# Patient Record
Sex: Male | Born: 1980 | Race: White | Hispanic: No | Marital: Single | Smoking: Current every day smoker
Health system: Southern US, Community
[De-identification: ages and names within clinical notes are randomized; demographics above are authoritative.]

## PROBLEM LIST (undated history)

## (undated) DIAGNOSIS — K219 Gastro-esophageal reflux disease without esophagitis: Secondary | ICD-10-CM

## (undated) DIAGNOSIS — C801 Malignant (primary) neoplasm, unspecified: Secondary | ICD-10-CM

## (undated) HISTORY — DX: Gastro-esophageal reflux disease without esophagitis: K21.9

## (undated) HISTORY — DX: Malignant (primary) neoplasm, unspecified: C80.1

---

## 2006-10-04 ENCOUNTER — Emergency Department: Payer: Self-pay | Admitting: Emergency Medicine

## 2011-09-11 ENCOUNTER — Emergency Department: Payer: Self-pay | Admitting: Emergency Medicine

## 2012-08-23 DIAGNOSIS — C801 Malignant (primary) neoplasm, unspecified: Secondary | ICD-10-CM

## 2012-08-23 HISTORY — DX: Malignant (primary) neoplasm, unspecified: C80.1

## 2013-03-01 ENCOUNTER — Encounter: Payer: Self-pay | Admitting: General Surgery

## 2013-03-01 ENCOUNTER — Ambulatory Visit (INDEPENDENT_AMBULATORY_CARE_PROVIDER_SITE_OTHER): Payer: Self-pay | Admitting: General Surgery

## 2013-03-01 VITALS — BP 110/72 | HR 60 | Resp 12 | Ht 72.0 in | Wt 221.0 lb

## 2013-03-01 DIAGNOSIS — M799 Soft tissue disorder, unspecified: Secondary | ICD-10-CM

## 2013-03-01 DIAGNOSIS — R229 Localized swelling, mass and lump, unspecified: Secondary | ICD-10-CM

## 2013-03-01 DIAGNOSIS — M7989 Other specified soft tissue disorders: Secondary | ICD-10-CM

## 2013-03-01 DIAGNOSIS — R222 Localized swelling, mass and lump, trunk: Secondary | ICD-10-CM

## 2013-03-01 DIAGNOSIS — C801 Malignant (primary) neoplasm, unspecified: Secondary | ICD-10-CM | POA: Insufficient documentation

## 2013-03-01 NOTE — Progress Notes (Signed)
Patient ID: Lance Bentley, male   DOB: [birth year], 32 y.o.   MRN: [id number]  Chief Complaint  Patient presents with  . Other    right upper back cystic mass    HPI Lance Bentley is a 32 y.o. male who presents for an evaluation of a right upper back cystic mass. Noticed in 2012 but over past 4 months it has gotten larger.  States he hasn't noticed much pain with the area. It has become increasingly prominent, increasing self-consciousness.  The patient reports no history of trauma to this area.   HPI  Past Medical History  Diagnosis Date  . GERD (gastroesophageal reflux disease)     History reviewed. No pertinent past surgical history.  History reviewed. No pertinent family history.  Social History History  Substance Use Topics  . Smoking status: Current Every Day Smoker -- 0.50 packs/day for 10 years  . Smokeless tobacco: Not on file  . Alcohol Use: Yes    No Known Allergies  No current outpatient prescriptions on file.   No current facility-administered medications for this visit.    Review of Systems Review of Systems  Constitutional: Negative.   Respiratory: Negative.   Cardiovascular: Negative.     Blood pressure 110/72, pulse 60, resp. rate 12, height 6' (1.829 m), weight 221 lb (100.245 kg).  Physical Exam Physical Exam  Constitutional: He is oriented to person, place, and time. He appears well-developed and well-nourished.  Neurological: He is alert and oriented to person, place, and time.  Skin: Skin is warm and dry.   6x7 cm right upper back lesion with slight overlying erythema and increased vascularity with no evidence of obvious infection or warmth. Data Reviewed No data reviewed.  Assessment    Sebaceous cyst of the right upper back.    Plan    Excision was discussed and accepted. The skin was prepped with alcohol and initially 30 cc of 0.5% Xylocaine with 0.25% Marcaine with 1-200,000 of epinephrine was utilized for local anesthesia and  well tolerated. This was supplemented with an additional 10 cc of 1% plain Xylocaine during the procedure. ChloraPrep applied to the skin. A transverse incision over the mass was completed the subcutaneous fat was quite thin. The mass was excised from the adjacent tissue with sharp dissection. A number of vascular attachments especially in the inferior medial aspect were ligated with 3-0 Vicryl figure-of-eight sutures and 3-0 Vicryl ties. The lesion extended down to the deep muscle fascia but did not file at the muscle. Approximately 25 cc of blood loss was encountered. The wound was closed in layers with interrupted 3-0 Vicryl figure-of-eight sutures in multiple layers. This was done to obliterate dead space. The skin was then closed with a running 3-0 Vicryl subcuticular suture. Benzoin and Steri-Strips followed by Telfa and Tegaderm dressing was applied. The patient initially declined prescription analgesics (reports a past history of cocaine use). He did call back about 4 hours post procedure for a a prescription.  The patient will followup next week with a nurse for wound check. He'll be contacted by phone when pathology is available.     Norco 5/325mg  1 or 2 every 4 hours as needed for pain #30 no refills called in to [facility]   Earline Mayotte 03/02/2013, 9:26 AM

## 2013-03-01 NOTE — Patient Instructions (Addendum)
Keep area clean The dressing is water proof you may shower May use tylenol or Advil for pain

## 2013-03-02 ENCOUNTER — Telehealth: Payer: Self-pay | Admitting: *Deleted

## 2013-03-02 ENCOUNTER — Encounter: Payer: Self-pay | Admitting: General Surgery

## 2013-03-02 DIAGNOSIS — R222 Localized swelling, mass and lump, trunk: Secondary | ICD-10-CM | POA: Insufficient documentation

## 2013-03-02 NOTE — Telephone Encounter (Signed)
Message copied by [name] on Fri Mar 02, 2013  9:33 AM ------      Message from: Lance Bentley, Lance Bentley      Created: Fri Mar 02, 2013  9:23 AM       Contact patient to see how he is doing. If needed, he can come in for RN dressing check this AM. Thanks.  ------

## 2013-03-02 NOTE — Telephone Encounter (Signed)
Patient states he is doing well. He has not noticed any drainage from the area.  The only complaint was pain when laying on his back.Patient to call us if he has any questions and will follow up with the nurse on 03/08/13.

## 2013-03-04 ENCOUNTER — Telehealth: Payer: Self-pay | Admitting: General Surgery

## 2013-03-04 DIAGNOSIS — C801 Malignant (primary) neoplasm, unspecified: Secondary | ICD-10-CM

## 2013-03-04 NOTE — Telephone Encounter (Signed)
The patient was contacted with the preliminary results of his recently completed right posterior shoulder excision. 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  We'll make arrangements to get together on July 14 at 5 PM to review treatment recommendations.

## 2013-03-05 ENCOUNTER — Encounter: Payer: Self-pay | Admitting: General Surgery

## 2013-03-05 ENCOUNTER — Ambulatory Visit (INDEPENDENT_AMBULATORY_CARE_PROVIDER_SITE_OTHER): Payer: Self-pay | Admitting: General Surgery

## 2013-03-05 VITALS — BP 130/72 | HR 82 | Resp 12 | Ht 72.0 in | Wt 221.0 lb

## 2013-03-05 DIAGNOSIS — C801 Malignant (primary) neoplasm, unspecified: Secondary | ICD-10-CM

## 2013-03-06 ENCOUNTER — Encounter: Payer: Self-pay | Admitting: General Surgery

## 2013-03-06 NOTE — Progress Notes (Signed)
Patient ID: Lance Bentley, male   DOB: Jan 20, 1981, 32 y.o.   MRN: [id number]  The patient had been contacted by phone with the results of his recent excisional biopsy. He is accompanied today by his mother.  Examination of the excision site shows some slight thickening medially, but otherwise a flat wound without erythema induration or fluctuance. Minimal tenderness.  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  This was a fairly large lesion of 5+ centimeters. Primary therapy is wide excision and with high-grade lesions, postoperative radiation therapy.  I think at present the ideal thing would be to allow his present would heal completely and then return in approximately 2-3 weeks for formal excision. In the interval a literature review we completed to determine if metastatic workup with CT is required.

## 2013-03-08 ENCOUNTER — Ambulatory Visit (INDEPENDENT_AMBULATORY_CARE_PROVIDER_SITE_OTHER): Payer: Self-pay | Admitting: *Deleted

## 2013-03-08 DIAGNOSIS — C801 Malignant (primary) neoplasm, unspecified: Secondary | ICD-10-CM

## 2013-03-08 NOTE — Progress Notes (Signed)
Patient ID: [employer], male   DOB: [birth year], 32 y.o.   MRN: [id number]  The patient came in for wound check today. The area is healing well with no redness or swelling.

## 2013-03-19 ENCOUNTER — Telehealth: Payer: Self-pay | Admitting: *Deleted

## 2013-03-19 DIAGNOSIS — C499 Malignant neoplasm of connective and soft tissue, unspecified: Secondary | ICD-10-CM

## 2013-03-19 DIAGNOSIS — C496 Malignant neoplasm of connective and soft tissue of trunk, unspecified: Secondary | ICD-10-CM

## 2013-03-19 NOTE — Telephone Encounter (Signed)
Please arrange for a PA and lateral chest x-ray: Diagnosis leiomyosarcoma of the back. Thank you  Patient notified as instructed and he will have completed tomorrow, 03-20-13.

## 2013-03-20 ENCOUNTER — Ambulatory Visit: Payer: Self-pay | Admitting: General Surgery

## 2013-03-21 ENCOUNTER — Encounter: Payer: Self-pay | Admitting: General Surgery

## 2013-03-23 ENCOUNTER — Telehealth: Payer: Self-pay | Admitting: *Deleted

## 2013-03-23 DIAGNOSIS — C499 Malignant neoplasm of connective and soft tissue, unspecified: Secondary | ICD-10-CM

## 2013-03-23 NOTE — Telephone Encounter (Signed)
Message copied by Nicholes Mango on Fri Mar 23, 2013  9:23 AM ------      Message from: Vaughn, Merrily Pew      Created: Thu Mar 22, 2013  4:09 PM      Regarding: RE: Patient       Contact: [phone]       Notify CXR was OK.  Will need additional surgery.  Now that CXR has been clear, he will need to have an MRI of the area prior to surgery. Left shoulder, attn: radiologist to be sure medial aspect of tumor is identified. W/ and W/O contrast.       OV after MRI completed.       Marcelino Duster: Arrange for       ----- Message -----         From: Jena Gauss, CMA         Sent: 03/22/2013   2:46 PM           To: Earline Mayotte, MD      Subject: Patient                                                  Pt called regarding surgery, he was wanting to know if he is going to have to have surgery or not, he was a little concerned when it might would be since he is going out of town sometime in the next week or so. He said he was waiting on chest xrays to come in and I scan them in the system already so they should be in there. Thanks!        ------

## 2013-03-23 NOTE — Telephone Encounter (Signed)
Patient notified as instructed. MRI Left shoulder WWO contrast has been scheduled for 03-29-13 at 3 pm (arrive 2:30 pm). Prep: none. He is aware of all instructions and will follow up in the office to review MRI and for pre-op evaluation.

## 2013-03-29 ENCOUNTER — Telehealth: Payer: Self-pay | Admitting: *Deleted

## 2013-03-29 ENCOUNTER — Ambulatory Visit: Payer: Self-pay | Admitting: General Surgery

## 2013-03-29 NOTE — Telephone Encounter (Signed)
Fleet Contras from the MRI department called to question order for left shoulder MRI WWO contrast. Dr. Lemar Livings was paged and verbally states this should have been the right shoulder. New order will be sent through order facilitator. Fleet Contras notified of change.

## 2013-04-04 ENCOUNTER — Encounter: Payer: Self-pay | Admitting: General Surgery

## 2013-04-04 ENCOUNTER — Ambulatory Visit (INDEPENDENT_AMBULATORY_CARE_PROVIDER_SITE_OTHER): Payer: Self-pay | Admitting: General Surgery

## 2013-04-04 VITALS — BP 130/72 | HR 80 | Resp 12 | Ht 72.0 in | Wt 216.0 lb

## 2013-04-04 DIAGNOSIS — C499 Malignant neoplasm of connective and soft tissue, unspecified: Secondary | ICD-10-CM

## 2013-04-04 NOTE — Progress Notes (Signed)
Patient ID: Lance Bentley, male   DOB: March 11, 1981, 32 y.o.   MRN: [id number]  Chief Complaint  Patient presents with  . Pre-op Exam    follow up mri left shoulder    HPI Lance Bentley is a 32 y.o. male who presents for a pre op evaluation. MRI of left shoulder was done on 03/29/13. The only complaint today is he has a boil on the right leg that showed up on 03/20/13. 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 This rub the area raw, he then went to [employer] and he noticed marked inflammation which has now nearly completely resolved.   The patient's mother accompanies him today. HPI  Past Medical History  Diagnosis Date  . GERD (gastroesophageal reflux disease)   . 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 No pertinent past surgical history.  No family history on file.  Social History History  Substance Use Topics  . Smoking status: Current Every Day Smoker -- 0.50 packs/day for 10 years  . Smokeless tobacco: Not on file  . Alcohol Use: Yes    No Known Allergies  No current outpatient prescriptions on file.   No current facility-administered medications for this visit.    Review of Systems Review of Systems  Constitutional: Negative.   Respiratory: Negative.   Cardiovascular: Negative.     Blood pressure 130/72, pulse 80, resp. rate 12, height 6' (1.829 m), weight 216 lb (97.977 kg).  Physical Exam Physical Exam  Constitutional: He is oriented to person, place, and time. He appears well-developed and well-nourished.  Cardiovascular: Normal rate, regular rhythm and normal heart sounds.   No murmur heard. Pulmonary/Chest: Effort normal and breath sounds normal.  Neurological: He is alert and oriented to person, place, and time.  Skin: Skin is warm and dry.     Well healed incision focal nodularity inferior.    Right posterior lateral knee healing area with focal thickening.     Data Reviewed Chest  x-ray showed no evidence of metastatic disease.  MRI showed no extension of the deep fascia and only local inflammatory changes from his biopsy.  Assessment    Leiomyosarcoma of the right posterior shoulder.    Plan    The patient states was present at the [facility] tumor board. Wide excision to negative margins will likely be really necessary therapy.  Risks associated with surgery including shoulder limitations while healing occurs were reviewed.  We'll postpone surgery to the end of the month to allow the inflammatory changes in the right popliteal fossa to completely resolve.     Patient's surgery has been scheduled for 04-19-13 at [facility].   Lance Bentley 04/04/2013, 10:31 PM

## 2013-04-04 NOTE — Patient Instructions (Addendum)
Patient to be scheduled for right back excision.   This patient's surgery has been scheduled for 04-19-13 at [facility].

## 2013-04-10 ENCOUNTER — Other Ambulatory Visit: Payer: Self-pay | Admitting: General Surgery

## 2013-04-10 ENCOUNTER — Ambulatory Visit: Payer: Self-pay | Admitting: General Surgery

## 2013-04-10 DIAGNOSIS — C499 Malignant neoplasm of connective and soft tissue, unspecified: Secondary | ICD-10-CM

## 2013-04-19 ENCOUNTER — Ambulatory Visit: Payer: Self-pay | Admitting: General Surgery

## 2013-04-19 DIAGNOSIS — C768 Malignant neoplasm of other specified ill-defined sites: Secondary | ICD-10-CM

## 2013-04-19 HISTORY — PX: OTHER SURGICAL HISTORY: SHX169

## 2013-04-20 ENCOUNTER — Encounter: Payer: Self-pay | Admitting: General Surgery

## 2013-04-20 LAB — PATHOLOGY REPORT

## 2013-04-24 ENCOUNTER — Encounter: Payer: Self-pay | Admitting: General Surgery

## 2013-04-25 ENCOUNTER — Encounter: Payer: Self-pay | Admitting: General Surgery

## 2013-04-25 ENCOUNTER — Ambulatory Visit: Payer: Self-pay | Admitting: General Surgery

## 2013-04-25 ENCOUNTER — Ambulatory Visit (INDEPENDENT_AMBULATORY_CARE_PROVIDER_SITE_OTHER): Payer: Self-pay | Admitting: General Surgery

## 2013-04-25 VITALS — BP 124/72 | HR 74 | Resp 12 | Ht 72.0 in | Wt 218.0 lb

## 2013-04-25 DIAGNOSIS — C499 Malignant neoplasm of connective and soft tissue, unspecified: Secondary | ICD-10-CM

## 2013-04-25 LAB — PATHOLOGY

## 2013-04-25 NOTE — Progress Notes (Signed)
Patient ID: Lance Bentley, male   DOB: 29-Apr-1981, 32 y.o.   MRN: [id number]  Chief Complaint  Patient presents with  . Routine Post Op    shoulder tumor excision    HPI Lance Bentley is a 32 y.o. male who presents for a post op right posterior shoulder excision. The procedure was done on 04/19/13. No complaints at this time.   HPI  Past Medical History  Diagnosis Date  . GERD (gastroesophageal reflux disease)   . 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 No pertinent past surgical history.  History reviewed. No pertinent family history.  Social History History  Substance Use Topics  . Smoking status: Current Every Day Smoker -- 0.50 packs/day for 10 years  . Smokeless tobacco: Not on file  . Alcohol Use: Yes    No Known Allergies  No current outpatient prescriptions on file.   No current facility-administered medications for this visit.    Review of Systems Review of Systems  Constitutional: Negative.   Respiratory: Negative.   Cardiovascular: Negative.     Blood pressure 124/72, pulse 74, resp. rate 12, height 6' (1.829 m), weight 218 lb (98.884 kg).  Physical Exam Physical Exam  Constitutional: He is oriented to person, place, and time. He appears well-nourished.  Neurological: He is oriented to person, place, and time.  Skin: Skin is warm and dry.   the incision is healing well. No evidence of undue tension. Staples were removed by the CMA and replaced with benzoin and Steri-Strips. The small nylon stitch to mark the area of closest approach of the tumor to the line of resection remains in place.  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 This is up stage from the original pathology.  Assessment    Doing well status post wide excision.    Plan    The patient's case will be brie present at the [facility] tumor board on September 4. The inferior margin is negative but very close for a DFSP  lesion. I anticipate that reresection to obtain a 2 cm margin will be appropriate. The upstaging too great to this of concern. The patient may be a candidate for post wide excision radiation therapy.        Earline Mayotte 04/27/2013, 10:43 AM

## 2013-04-25 NOTE — Patient Instructions (Addendum)
Patient to return in 3 weeks.  

## 2013-04-27 ENCOUNTER — Encounter: Payer: Self-pay | Admitting: General Surgery

## 2013-04-30 ENCOUNTER — Telehealth: Payer: Self-pay

## 2013-04-30 ENCOUNTER — Ambulatory Visit: Payer: Self-pay | Admitting: General Surgery

## 2013-04-30 NOTE — Telephone Encounter (Signed)
Message copied by Sinda Du on Mon Apr 30, 2013 12:54 PM ------      Message from: Earline Mayotte      Created: Fri Apr 27, 2013 10:42 AM       Please notify the patient that his case was presented at our weekly tumor conference yesterday.  We need to proceed with re-excision after his next follow up to get the appropriate margin of tissue. (As we discussed at the time of his Sept 3 appt). ------

## 2013-04-30 NOTE — Telephone Encounter (Signed)
Message copied by Sinda Du on Mon Apr 30, 2013  1:36 PM ------      Message from: [place], [place]      Created: Fri Apr 27, 2013 10:42 AM       Please notify the patient that his case was presented at our weekly tumor conference yesterday.  We need to proceed with re-excision after his next follow up to get the appropriate margin of tissue. (As we discussed at the time of his Sept 3 appt). ------

## 2013-04-30 NOTE — Telephone Encounter (Signed)
Patient returning call. Patient notified of need for re-excision and is ok with this. Will return on the 24th for follow up visit.

## 2013-05-04 ENCOUNTER — Telehealth: Payer: Self-pay | Admitting: *Deleted

## 2013-05-04 NOTE — Telephone Encounter (Signed)
Patient called the office reporting that he is having pain in his right upper shoulder status post excision done one 04-19-13. This patient states that it is very tight where the stitches are and the pain in his neck feels like someone has punched him. He has returned to work and states that he is not taking anything for the pain at this time. Patient reports that you had given him a prescription for Percocet after surgery but he could not tolerate this as it made his stomach hurt and made him throw up.  I told him he could take Aleve or Advil for the pain since he is already back at work. This patient has an appointment for follow up on 05-16-13. Patient would appreciate a call back from you on his cell number to address his concerns.

## 2013-05-05 ENCOUNTER — Telehealth: Payer: Self-pay | Admitting: General Surgery

## 2013-05-05 NOTE — Telephone Encounter (Signed)
Patient called yesterday reporting increased pain. 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 Encourage to use Aleve, 2 tabs BID, with tylenol prn at HS. Heating pad (taking care to position in an area of skin with normal sensation will help as well.  May need to change Sept 24 appt secondary to work needs.   He will call the office Monday, Sept 15 to reschedule.

## 2013-05-16 ENCOUNTER — Ambulatory Visit: Payer: Self-pay | Admitting: General Surgery

## 2013-06-11 ENCOUNTER — Encounter: Payer: Self-pay | Admitting: General Surgery

## 2013-06-11 ENCOUNTER — Ambulatory Visit (INDEPENDENT_AMBULATORY_CARE_PROVIDER_SITE_OTHER): Payer: Self-pay | Admitting: General Surgery

## 2013-06-11 VITALS — BP 146/100 | HR 88 | Resp 14 | Ht 72.0 in | Wt 229.0 lb

## 2013-06-11 DIAGNOSIS — C499 Malignant neoplasm of connective and soft tissue, unspecified: Secondary | ICD-10-CM

## 2013-06-11 NOTE — Progress Notes (Signed)
Patient ID: Lance Bentley, male   DOB: 09-21-1980, 32 y.o.   MRN: [id number]  Chief Complaint  Patient presents with  . Follow-up    folllow up sarcoma on back    HPI Lance Bentley is a 32 y.o. male who presents for a follow up of a right back sarcoma. The patient underwent a wide excision of the right back on 04/19/13. Patient denies any new problems at this time and is overall doing well.   HPI  Past Medical History  Diagnosis Date  . GERD (gastroesophageal reflux disease)   . 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 Wide excision Right 04/19/13    right back excision leiomyosarcome    History reviewed. No pertinent family history.  Social History History  Substance Use Topics  . Smoking status: Current Every Day Smoker -- 0.50 packs/day for 10 years  . Smokeless tobacco: Never Used  . Alcohol Use: Yes    No Known Allergies  No current outpatient prescriptions on file.   No current facility-administered medications for this visit.    Review of Systems Review of Systems  Constitutional: Negative.   Respiratory: Negative.   Cardiovascular: Negative.     Blood pressure 146/100, pulse 88, resp. rate 14, height 6' (1.829 m), weight 229 lb (103.874 kg).  Physical Exam Physical Exam  Constitutional: He is oriented to person, place, and time. He appears well-developed and well-nourished.  Neck: No thyromegaly present.  Cardiovascular: Normal rate, regular rhythm and normal heart sounds.   No murmur heard. Pulmonary/Chest: Effort normal and breath sounds normal.  Lymphadenopathy:    He has no cervical adenopathy.  Neurological: He is alert and oriented to person, place, and time.  Skin: Skin is warm and dry.     Well healed scar on posterior right shoulder.     Data Reviewed Pathology dated April 19, 2013 showed a dermatofibrosarcoma protuberans overlying fibrosarcomatous changes at the previous biopsy  site.the tumor size was estimated at up to 9.8 cm. All margins were negative, although the inferior margin was close at 1 mm. The deep margin was 1.2 cm which represented the underlying fascia. T2a.  The patient's case had been presented at the [facility] tumor board and wide excision to a 2 cm margin inferiorly with postoperative radiation therapy due to the findings of DFSP was recommended.  Assessment    Dermatofibrosarcoma protuburans.     Plan    The indications for reexcision of the inferior margin was discussed as well as the recommendations for post surgery radiation therapy.     Patient's surgery has been scheduled for 07-24-13 at [facility].   Earline Mayotte 06/11/2013, 9:25 PM

## 2013-06-11 NOTE — Patient Instructions (Addendum)
Patient to be scheduled for surgery.   Patient's surgery has been scheduled for 07-24-13 at [facility].

## 2013-07-14 ENCOUNTER — Other Ambulatory Visit: Payer: Self-pay | Admitting: General Surgery

## 2013-07-14 DIAGNOSIS — C499 Malignant neoplasm of connective and soft tissue, unspecified: Secondary | ICD-10-CM

## 2013-07-24 ENCOUNTER — Ambulatory Visit: Payer: Self-pay | Admitting: General Surgery

## 2013-07-24 DIAGNOSIS — D235 Other benign neoplasm of skin of trunk: Secondary | ICD-10-CM

## 2013-07-24 HISTORY — PX: SKIN LESION EXCISION: SHX2412

## 2013-07-25 ENCOUNTER — Encounter: Payer: Self-pay | Admitting: General Surgery

## 2013-07-28 ENCOUNTER — Telehealth: Payer: Self-pay | Admitting: General Surgery

## 2013-07-28 NOTE — Telephone Encounter (Signed)
A message was left for the patient at the recent resection showed no evidence of residual dermatofibroma sarcoma protuberans.

## 2013-07-30 ENCOUNTER — Encounter: Payer: Self-pay | Admitting: General Surgery

## 2013-07-31 ENCOUNTER — Ambulatory Visit (INDEPENDENT_AMBULATORY_CARE_PROVIDER_SITE_OTHER): Payer: Self-pay | Admitting: General Surgery

## 2013-07-31 ENCOUNTER — Encounter: Payer: Self-pay | Admitting: General Surgery

## 2013-07-31 VITALS — BP 150/80 | HR 84 | Temp 96.7°F | Resp 18 | Ht 72.0 in | Wt 225.0 lb

## 2013-07-31 DIAGNOSIS — C499 Malignant neoplasm of connective and soft tissue, unspecified: Secondary | ICD-10-CM

## 2013-07-31 NOTE — Patient Instructions (Addendum)
Aleve 2 tablets twice a a day May use heating pad for comfort. prescription for Percocet 5/325 mg #30 given

## 2013-07-31 NOTE — Progress Notes (Signed)
Patient ID: Hatem Cull, male   DOB: Jul 13, 1981, 32 y.o.   MRN: [id number]  Chief Complaint  Patient presents with  . Routine Post Op    HPI Jacque Garrels is a 32 y.o. male.  Here for follow up excision right mass lesion done 07-24-13.  States he is having pain in the area and has run out of pain medication, using ibuprofen for pain. Drain remains in place. HPI  Past Medical History  Diagnosis Date  . GERD (gastroesophageal reflux disease)   . 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 Wide excision Right 04/19/13    right back excision leiomyosarcome  . Skin lesion excision  07-24-13    No family history on file.  Social History History  Substance Use Topics  . Smoking status: Current Every Day Smoker -- 0.50 packs/day for 10 years  . Smokeless tobacco: Never Used  . Alcohol Use: Yes    No Known Allergies  Current Outpatient Prescriptions  Medication Sig Dispense Refill  . ibuprofen (ADVIL,MOTRIN) 200 MG tablet Take 200 mg by mouth every 6 (six) hours as needed.       No current facility-administered medications for this visit.    Review of Systems Review of Systems  Constitutional: Negative.   Respiratory: Negative.   Cardiovascular: Negative.     Blood pressure 150/80, pulse 84, temperature 96.7 F (35.9 C), temperature source Oral, resp. rate 18, height 6' (1.829 m), weight 225 lb (102.059 kg).  Physical Exam Physical Exam  Constitutional: He is oriented to person, place, and time. He appears well-developed and well-nourished.  Neurological: He is alert and oriented to person, place, and time.  Skin: Skin is warm and dry.  Drain removed, gauze applied. Sutures removed and steri strips applied.    Data Reviewed Pathology on the resected specimen showed no evidence of residual disease. The drain record shows volumes follow to less than 30 cc per day for the last 3 days.  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     Plan    The dissection used to remove the old seroma cavity is likely the source of the patient's pain. He had been advised to make use of Aleve, 2 tablets b.i.d. On his discharge instructions. He'll begin this today. A new prescription for Percocet 5/325, #30 with description 1 p.o. Q.4 h. P.r.n. For pain was provided. No refills. Caution with narcotic use was emphasized to his past history of addiction issues. He reports his girlfriend, Herbert Seta, is in charge of his pain pills. The patient was advised that he may require an aspiration on followup that this is not an indication of any tumor recurrence.  After his last surgery with the identification of a left well-differentiated tumor, post surgery radiation therapy was recommended during review by the [facility] tumor board. We'll make arrangements for evaluation by radiation oncology at his next visit.         Earline Mayotte 07/31/2013, 9:23 PM

## 2013-08-06 ENCOUNTER — Encounter: Payer: Self-pay | Admitting: General Surgery

## 2013-08-06 ENCOUNTER — Ambulatory Visit (INDEPENDENT_AMBULATORY_CARE_PROVIDER_SITE_OTHER): Payer: Self-pay | Admitting: General Surgery

## 2013-08-06 ENCOUNTER — Other Ambulatory Visit: Payer: Self-pay | Admitting: *Deleted

## 2013-08-06 VITALS — BP 128/76 | HR 78 | Resp 14 | Ht 72.0 in | Wt 225.0 lb

## 2013-08-06 DIAGNOSIS — C44599 Other specified malignant neoplasm of skin of other part of trunk: Secondary | ICD-10-CM | POA: Insufficient documentation

## 2013-08-06 DIAGNOSIS — C499 Malignant neoplasm of connective and soft tissue, unspecified: Secondary | ICD-10-CM

## 2013-08-06 NOTE — Progress Notes (Signed)
Patient has been scheduled for an appointment with Dr. Rushie Chestnut at the [facility] for 08-10-13 at 9 am. This patient is aware of date, time, and instructions.

## 2013-08-06 NOTE — Progress Notes (Signed)
Patient ID: Lance Bentley, male   DOB: 1981/03/22, 32 y.o.   MRN: [id number]  Chief Complaint  Patient presents with  . Routine Post Op    excision    HPI Lance Bentley is a 32 y.o. male here for follow up excision right mass lesion done 07-24-13.Patient states he is having no pain in his back.    HPI  Past Medical History  Diagnosis Date  . GERD (gastroesophageal reflux disease)   . 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 Wide excision Right 04/19/13    right back excision leiomyosarcome  . Skin lesion excision  07-24-13    No family history on file.  Social History History  Substance Use Topics  . Smoking status: Current Every Day Smoker -- 0.50 packs/day for 10 years  . Smokeless tobacco: Never Used  . Alcohol Use: Yes    No Known Allergies  Current Outpatient Prescriptions  Medication Sig Dispense Refill  . ibuprofen (ADVIL,MOTRIN) 200 MG tablet Take 200 mg by mouth every 6 (six) hours as needed.       No current facility-administered medications for this visit.    Review of Systems Review of Systems  Constitutional: Negative.   Respiratory: Negative.   Cardiovascular: Negative.     Blood pressure 128/76, pulse 78, resp. rate 14, height 6' (1.829 m), weight 225 lb (102.059 kg).  Physical Exam Physical Exam  Constitutional: He is oriented to person, place, and time. He appears well-nourished.  Eyes: No scleral icterus.  Lymphadenopathy:    He has no cervical adenopathy.  Neurological: He is alert and oriented to person, place, and time.  Skin: Skin is warm and dry.  Drained 30ml .  The patient shows good range of motion in the right shoulder. There is evidence of a small seroma collection.    Data Reviewed ChloraPrep was applied to the skin. 30 cc of serous fluid was aspirated. Procedure was well tolerated.  Assessment    Dermatofibrosarcoma protuberans right posterior shoulder.  Small  seroma post-drain removal.    Plan    Will arrange for followup exam in one week for repeat aspiration.  Radiation oncology appointment will be scheduled per recommendations of the [facility] tumor board.       Earline Mayotte 08/06/2013, 1:37 PM

## 2013-08-10 ENCOUNTER — Ambulatory Visit: Payer: Self-pay | Admitting: Radiation Oncology

## 2013-08-15 ENCOUNTER — Ambulatory Visit: Payer: Self-pay | Admitting: General Surgery

## 2013-08-23 ENCOUNTER — Ambulatory Visit: Payer: Self-pay | Admitting: Radiation Oncology

## 2013-09-23 ENCOUNTER — Ambulatory Visit: Payer: Self-pay | Admitting: Radiation Oncology

## 2013-09-26 ENCOUNTER — Encounter: Payer: Self-pay | Admitting: *Deleted

## 2013-09-26 LAB — CBC CANCER CENTER
Basophil #: 0.1 x10 3/mm (ref 0.0–0.1)
Basophil %: 1.3 %
Eosinophil #: 0.3 x10 3/mm (ref 0.0–0.7)
Eosinophil %: 2.4 %
HCT: 44.7 % (ref 40.0–52.0)
HGB: 15 g/dL (ref 13.0–18.0)
Lymphocyte #: 2.6 x10 3/mm (ref 1.0–3.6)
Lymphocyte %: 23.5 %
MCH: 31.1 pg (ref 26.0–34.0)
MCHC: 33.6 g/dL (ref 32.0–36.0)
MCV: 93 fL (ref 80–100)
MONO ABS: 0.9 x10 3/mm (ref 0.2–1.0)
Monocyte %: 8.5 %
Neutrophil #: 7.1 x10 3/mm — ABNORMAL HIGH (ref 1.4–6.5)
Neutrophil %: 64.3 %
PLATELETS: 234 x10 3/mm (ref 150–440)
RBC: 4.83 10*6/uL (ref 4.40–5.90)
RDW: 13.1 % (ref 11.5–14.5)
WBC: 11.1 x10 3/mm — ABNORMAL HIGH (ref 3.8–10.6)

## 2013-10-03 LAB — CBC CANCER CENTER
BASOS ABS: 0.1 x10 3/mm (ref 0.0–0.1)
BASOS PCT: 1.1 %
Eosinophil #: 0.3 x10 3/mm (ref 0.0–0.7)
Eosinophil %: 2.8 %
HCT: 48.1 % (ref 40.0–52.0)
HGB: 15.9 g/dL (ref 13.0–18.0)
Lymphocyte #: 2.3 x10 3/mm (ref 1.0–3.6)
Lymphocyte %: 24.4 %
MCH: 31 pg (ref 26.0–34.0)
MCHC: 33.1 g/dL (ref 32.0–36.0)
MCV: 94 fL (ref 80–100)
MONOS PCT: 10.2 %
Monocyte #: 1 x10 3/mm (ref 0.2–1.0)
NEUTROS ABS: 5.8 x10 3/mm (ref 1.4–6.5)
Neutrophil %: 61.5 %
PLATELETS: 260 x10 3/mm (ref 150–440)
RBC: 5.14 10*6/uL (ref 4.40–5.90)
RDW: 13.7 % (ref 11.5–14.5)
WBC: 9.4 x10 3/mm (ref 3.8–10.6)

## 2013-10-21 ENCOUNTER — Ambulatory Visit: Payer: Self-pay | Admitting: Radiation Oncology

## 2013-11-12 ENCOUNTER — Telehealth: Payer: Self-pay

## 2013-11-12 NOTE — Telephone Encounter (Signed)
Message copied by Lesly Rubenstein on Mon Nov 12, 2013  4:17 PM ------      Message from: [place], [place]      Created: Mon Nov 05, 2013 11:11 AM       See if we can get the patient in for an exam in July. He works out of town, so anytime between June-August would be fine. No charge. ------

## 2013-11-12 NOTE — Telephone Encounter (Signed)
Message left for patient to call and schedule follow up appointment with Dr Bary Castilla sometime in June, July, or August at no charge.

## 2014-05-01 ENCOUNTER — Encounter: Payer: Self-pay | Admitting: General Surgery

## 2014-05-01 ENCOUNTER — Ambulatory Visit (INDEPENDENT_AMBULATORY_CARE_PROVIDER_SITE_OTHER): Payer: Self-pay | Admitting: General Surgery

## 2014-05-01 ENCOUNTER — Ambulatory Visit: Payer: Self-pay | Admitting: General Surgery

## 2014-05-01 VITALS — BP 130/78 | HR 88 | Resp 12 | Ht 72.0 in | Wt 228.0 lb

## 2014-05-01 DIAGNOSIS — C801 Malignant (primary) neoplasm, unspecified: Secondary | ICD-10-CM

## 2014-05-01 NOTE — Patient Instructions (Signed)
Patient to return in 1 year for follow up. The patient is aware to call back for any questions or concerns.  

## 2014-05-01 NOTE — Progress Notes (Signed)
Patient ID: Lance Bentley, male   DOB: Sep 30, 1980, 33 y.o.   MRN: [id number]  Chief Complaint  Patient presents with  . Follow-up    wide excision left back     HPI Lance Bentley is a 33 y.o. male who presents for a post op follow up of a left back wide excision. The procedure was performed on 07/24/13 which was a re-excision.  The patient denies any new problems at this time. Patient doing well. 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 He reports no difficulty with his shoulder.  HEENT head excision followed by wide excision followed by a final excision to widely negative (2 cm) margins. He completed most of the planned schedule external beam radiation.  The patient is accompanied today by his 4-1/70-month-old daughter, Lance Bentley.  Mom, Lance Bentley did not accompany the patient.   HPI  Past Medical History  Diagnosis Date  . GERD (gastroesophageal reflux disease)   . 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 Wide excision Right 04/19/13    right back excision leiomyosarcome  . Skin lesion excision  07-24-13  . 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    History reviewed. No pertinent family history.  Social History History  Substance Use Topics  . Smoking status: Current Every Day Smoker -- 0.50 packs/day for 10 years  . Smokeless tobacco: Never Used  . Alcohol Use: Yes    No Known Allergies  Current Outpatient Prescriptions  Medication Sig Dispense Refill  . ranitidine (ZANTAC) 150 MG capsule Take 150 mg by mouth every evening.       No current facility-administered medications for this visit.    Review of Systems Review of Systems  Constitutional: Negative.   Respiratory: Negative.   Cardiovascular: Negative.     Blood pressure 130/78, pulse 88, resp. rate 12, height 6' (1.829 m),  weight 228 lb (103.42 kg).  Physical Exam Physical Exam  Cardiovascular: Normal rate and regular rhythm.   Pulmonary/Chest: Effort normal and breath sounds normal.    Lymphadenopathy:    He has no cervical adenopathy.    He has no axillary adenopathy.       Assessment    No evidence of local recurrence    Plan    The patient is amenable to having a chest x-ray to rule out pulmonary metastatic disease. This will be completed today. We'll plan for followup examination in one year.    PCP: Crecencio Mc Ref. MD: Dr. Rhodia Albright, [place] Gleason 05/01/2014, 10:33 AM

## 2014-05-02 ENCOUNTER — Telehealth: Payer: Self-pay

## 2014-05-02 NOTE — Telephone Encounter (Signed)
message left for patient to call back for his CXR results.

## 2014-05-02 NOTE — Telephone Encounter (Signed)
Message copied by Lesly Rubenstein on Thu May 02, 2014  8:11 AM ------      Message from: [place], [place] W      Created: Wed May 01, 2014  9:54 PM       Please notify the patient the CXR looks fine. F/U in one year as discussed.  Thanks.      ----- Message -----         From: Darrin Nipper, CMA         Sent: 05/01/2014   4:46 PM           To: Robert Bellow, MD                   ------

## 2014-05-06 NOTE — Telephone Encounter (Signed)
Notified patient as instructed, patient pleased. Discussed follow-up for next September, patient agrees.

## 2014-06-05 ENCOUNTER — Telehealth: Payer: Self-pay | Admitting: *Deleted

## 2014-06-05 NOTE — Telephone Encounter (Signed)
Very rare to have a second tumor. More likely muscle strain. 2 Aleve twice a day and local heat or ice as comforting. Will be glad to see him when he returns.

## 2014-06-05 NOTE — Telephone Encounter (Signed)
Pt called and said he thinks he has another mass coming up on the opposite side of back, he had one removed last year. He said it has a burning feeling and feels like something in it. He wanted to know what he should do to help relieve the pain and should he come in for an appointment? He is in [place] right now and will be home Nov 17-22

## 2014-06-06 NOTE — Telephone Encounter (Signed)
Left message with instructions, and to call back to confirm message received.

## 2014-12-13 NOTE — Op Note (Signed)
PATIENT NAME:  Lance Bentley, Lance Bentley MR#:  [id number] DATE OF BIRTH:  Oct 13, 1980  DATE OF PROCEDURE:  04/19/2013  PREOPERATIVE DIAGNOSIS: Leiomyosarcoma of the right back.   POSTOPERATIVE DIAGNOSIS: Leiomyosarcoma of the right back.  OPERATIVE PROCEDURE: Wide excision leiomyosarcoma of the right back.   SURGEON: Hervey Ard MD.   ANESTHESIA: General endotracheal under Dr.Banhdari , Marcaine 0.5% with 1:200,000 units of epinephrine, 30 mL local infiltration.   ESTIMATED BLOOD LOSS: Less than 50 mL.   CLINICAL NOTE: This 34 year old male, who presented with a right posterior shoulder mass and excision showed evidence of a leiomyosarcoma, intermediate grade. Preoperative chest x-ray was negative. MRI showed no fascial involvement. He was felt to be a candidate for wide excision.   OPERATIVE NOTE: With the patient under adequate general endotracheal anesthesia, he was rolled to the prone position and appropriately padded. The left hip was cleared where skin graft was needed and sites were prepped with ChloraPrep and draped. Attention was turned to the wide excision site. There was a well-healed transverse incision medial to the right scapular spine and just below this 2 less than 1 cm nodules. There was slight thickening approximately 5 cm below the area of wide excision that had not been appreciated on past office exams. A 3 cm margin from any nodularity near the wound and immediately inferior to the focal thickening inferiorly was outlined. Marcaine was infiltrated for postoperative analgesia and hemostasis. The skin was incised sharply on this somewhat oval area and the remaining dissection completed with electrocautery. Hemostasis was with 3-0 Vicryl figure-of-eight sutures and 3-0 Vicryl ties. The lesion was extended down to and including the fascia of the underlying muscles. The medial edge of the incision was the paraspinal tissue. The specimen was orientated and gross examination by pathology  showed again only appreciated focal thickening with no obvious lesion on sectioning.   The skin was tagged at the area of focal thickening with a 3-0 nylon stitch should re-excision to be required. The skin flaps were then elevated off the underlying fascia approximately 5 cm in all directions. The soft tissue easily approximated and this was completed using interrupted 2-0 Vicryl figure-of-eight sutures in multiple layers. The skin was approximated with interrupted 4-0 Vicryl subcuticular sutures followed by staples. Telfa and Tegaderm dressing was applied. The patient tolerated the procedure well and was taken to the recovery room in stable condition.  ____________________________ Robert Bellow, MD jwb:aw D: 04/19/2013 09:15:26 ET T: 04/19/2013 09:55:24 ET JOB#: [id number]  cc: Robert Bellow, MD, <Dictator> Lunetta Marina Amedeo Kinsman MD ELECTRONICALLY SIGNED 04/20/2013 22:25

## 2014-12-13 NOTE — Consult Note (Signed)
Reason for Visit: This 34 year old Male patient presents to the clinic for initial evaluation of  sarcoma .   Referred by Dr. Hervey Ard.  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 Initially thought to be cystic-type lesion and was observed. Started rapid growth in the summer of 2014. Was seen by Dr. Hervey Ard who performed a wide local excision for a 9.8 cm dermatofibroma sarcoma protuberance. Were actually to the separate parts of the specimen 4.4 cm and 5.4 giving aggregate dimension of 9.8 cm. Inferior margin was close at 1 mm. Was a 2 cm peripheral margin which was clear. Patient underwent reexcision with all margins clear. S1 100 special staining was negative smooth muscle actin was focally positive. Patient has done well. Had an MRI scan which shows some residual activity in the surgical site cannot differentiate whether this is residual disease or surgical scarring. Patient has good mobility in his upper extremity although does occasionally cause pain on moderate activity. He has had workup including chest x-ray showing no evidence of metastatic disease in his chest. His case was discussed her weekly tumor conference and recommendation for postoperative radiation was made.  Past Hx:    Leiomyosarcoma:    Tobacco Use:    Wide Excision right back leiomyosarcoma: Aug 2014   Right shoulder tumor removal: 2014  Past, Family and Social History:  Past Medical History positive   Past Surgical History wide local excision of sarcoma as described above.   Family History  noncontributory   Social History noncontributory   Additional Past Medical and Surgical History patient seen by himself today   Allergies:   No Known Allergies:   Home Meds:  Home Medications: Medication Instructions Status  Rolaids 550 mg-110 mg oral tablet, chewable 2 tab(s) orally every hour, As Needed Active  Tums 500 mg oral tablet, chewable 1 tab(s) orally 3 times a day, As Needed Active  Aleve sodium 220 mg oral tablet 2 tab(s) orally 2 times a day for five days, then as needed for soreness.  Active  PriLOSEC 20 mg oral delayed release tablet 1 tab(s) orally once a day Active   Review of Systems:  General negative   Performance Status (ECOG) 0   Skin see HPI   Breast negative   Ophthalmologic negative   ENMT negative   Respiratory and Thorax negative   Cardiovascular negative   Gastrointestinal negative   Genitourinary negative   Musculoskeletal negative   Neurological negative   Psychiatric negative   Hematology/Lymphatics negative   Endocrine negative   Allergic/Immunologic negative   Review of Systems   review of systems obtained from nurses notes  Nursing Notes:  Nursing Vital Signs and Chemo Nursing Nursing Notes: *CC Vital Signs Flowsheet:   19-Dec-14 09:26  Temp Temperature 96.4  Pulse Pulse 82  Respirations Respirations 20  SBP SBP 161  DBP DBP 82  Pain Scale (0-10)  3 - 7 Right Shoulder  Current Weight (kg) (kg) 104.1  Height (cm) centimeters 184.3  BSA (m2) 2.2   Physical Exam:  General/Skin/HEENT:  General normal   Eyes normal  ENMT normal   Head and Neck normal   Additional PE well-developed well-nourished male in NAD. Patient has a well healed excisional site on his back more Center towards the left shoulder but crossing the midline. Still some granulation tissue present in the center of the scar. No supraclavicular or axillary adenopathy is appreciated. Lungs are clear to A&P cardiac examination shows regular rate  and rhythm. Abdomen is benign.   Breasts/Resp/CV/GI/GU:  Respiratory and Thorax normal   Cardiovascular normal   Gastrointestinal normal   Genitourinary normal   MS/Neuro/Psych/Lymph:  Musculoskeletal normal   Neurological normal   Lymphatics normal   Other Results:  Radiology Results: LabUnknown:    29-Jul-14 13:26, [facility]  PACS Image     07-Aug-14 16:25, MRI Shoulder Right [facility]  PACS Image   MRI:  MRI Shoulder Right [facility]   REASON FOR EXAM:    ATTN RADIOLOGIST BE SURE TO ID THE MEDIAL ASPECT OF   THE TUMOR Leiomyosarcoma  COMMENTS:       PROCEDURE: MR  - MR SHOULDER RT  WO/W CONTRAST  - Mar 29 2013  4:25PM     RESULT: History: History: Leiomyosarcoma    Comparison: None    Technique: Multiplanar and multisequence MRI of the upper back soft   tissues is performed prior to and following 20 mL of Multihance   intravenous contrast.    Findings:  There is a 4.6 x 1.3 x 4.8 cm area of mild T2 hyperintensity and   intermediate T1 signal with enhancement on postcontrast images in the   upper right back to the right of midline, located within the subcutaneous   fat. There is no extension into the fascia of the underlying musculature.   There is no other soft tissue or fluid attenuating mass. There is no   hematoma. The visualized osseous structures are normal.    IMPRESSION:     4.6 x 1.3 x 4.8 cm area of enhancement in the upper right back to the   right of midline within the subcutaneous fat likely representing a   combination of post surgical changes and residual malignancy. There is no   evidence of extension to the fascia.      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 Plan:    based on the size initial close margin of the sarcoma. Would favor going ahead with adjuvant radiation therapy. Would plan on delivering 6000 cGy over 6 weeks using electron beam. Risks and benefits of treatment clinic skin reaction, fatigue, possible alteration blood counts, and possible decreased mobility were all discussed in detail with the patient. I have emphasized the need to continue therapy using his upper extremities as much as possible to decrease problems with scar buildup. Patient seems to [place] treatment plan well. I set him up for CT simulation in the near future. was discussed at our weekly tumor conference and the following recommendations made at the meeting.  I would like to take this opportunity to thank you for allowing me to continue to participate in this patient's care.  CC Referral:  cc: Dr. Hervey Ard   Electronic Signatures: Baruch Gouty, Roda Shutters (MD)  (Signed 19-Dec-14 10:10)  Authored: HPI, Diagnosis, Past Hx, PFSH, Allergies,  Home Meds, ROS, Nursing Notes, Physical Exam, Other Results, Relevent Results, Encounter Assessment and Plan, CC Referring Physician   Last Updated: 19-Dec-14 10:10 by Armstead Peaks (MD)

## 2014-12-13 NOTE — Op Note (Signed)
PATIENT NAME:  Lance Bentley, Lance Bentley MR#:  [id number] DATE OF BIRTH:  11-May-1981  DATE OF PROCEDURE:  07/24/2013  PREOPERATIVE DIAGNOSIS: Leiomyosarcoma of right posterior shoulder with close but negative margins.   POSTOPERATIVE DIAGNOSIS: Leiomyosarcoma of the right posterior shoulder with close, but negative margins.    OPERATIVE PROCEDURE: Re-excision of inferior margin with primary closure and excision of seroma cavity.   SURGEON: Hervey Ard, MD   ANESTHESIA: General by LMA under Dr. Marcello Moores, Marcaine 0.5% plain, 30 mL local infiltration.   CLINICAL NOTE: This young man had originally undergone excision of a suspected back cyst in the summer of 2014. This was found to be a malignant spindle cell tumor. In August of this year, he underwent wide excision and was found to have a dermatofibrosarcoma protuberans with a close but negative inferior margin. This was only 3 mm. The remaining margins were at least 1.6 in the deep level of the spinal muscles and greater than 2 cm other areas. He returns this time for planned re-excision of the inferior margin.   OPERATIVE NOTE: With the patient under adequate general anesthesia, the area was prepped with ChloraPrep and draped. The patient had been rolled to the left lateral decubitus position and supported on a beanbag with appropriate padding, with axillary rolls and a pillow between the legs. Marcaine was infiltrated for postoperative analgesia. A previously placed Prolene suture was felt to be located in the mid to lateral aspect of the wound. This had been the site of closest involvement. There was a small dermal thickening appreciated just superior to this as well. An elliptical incision with an ellipsoid superior aspect to include the palpable dermal thickening was outlined. The skin was incised sharply and the remaining dissection completed with electrocautery. The patient was found to have a seroma cavity measuring approximately 2.5 cm in diameter and  approximately 8 cm in width. The seroma cavity wall was excised with electrocautery. After the wound was cleared, the specimen was orientated and sent in formalin, per prior discussion with the pathologist. A Blake drain was brought out through a separate stab wound incision in the inferolateral aspect of the superior posterior chest. This was anchored in place with 3-0 nylon. The deep tissue was then approximated with 2-0 Vicryl figure-of-eight sutures. The deep dermal layer was approximated 3-0 Vicryl sutures. The skin was closed with interrupted 4-0 nylon horizontal mattress sutures.   Telfa and Tegaderm dressing was applied. The Blake drain was placed to self-suction. The patient tolerated the procedure well and was taken to the recovery room in stable condition.   ____________________________ Robert Bellow, MD jwb:cc D: 07/24/2013 21:06:47 ET T: 07/24/2013 21:59:16 ET JOB#: [id number]  cc: Robert Bellow, MD, <Dictator> Olivette Beckmann Amedeo Kinsman MD ELECTRONICALLY SIGNED 07/25/2013 8:16

## 2015-05-05 ENCOUNTER — Ambulatory Visit: Payer: Self-pay | Admitting: General Surgery

## 2015-06-11 ENCOUNTER — Encounter: Payer: Self-pay | Admitting: *Deleted

## 2018-08-31 ENCOUNTER — Ambulatory Visit: Payer: Self-pay | Admitting: Surgery

## 2018-08-31 MED ORDER — CEFAZOLIN SODIUM-DEXTROSE 2-4 GM/100ML-% IV SOLN
2.0000 g | INTRAVENOUS | Status: AC
  Administered 2018-09-01: 2 g via INTRAVENOUS

## 2018-09-01 ENCOUNTER — Ambulatory Visit: Payer: Self-pay | Admitting: Certified Registered Nurse Anesthetist

## 2018-09-01 ENCOUNTER — Encounter
Admission: RE | Disposition: A | Discharge status: Home / Self Care | Payer: Self-pay | Source: Home / Self Care | Attending: Surgery

## 2018-09-01 ENCOUNTER — Encounter: Payer: Self-pay | Admitting: *Deleted

## 2018-09-01 ENCOUNTER — Observation Stay
Admission: RE | Admit: 2018-09-01 | Discharge: 2018-09-01 | Disposition: A | Discharge status: Home / Self Care | Payer: Self-pay | Attending: Surgery | Admitting: Surgery

## 2018-09-01 ENCOUNTER — Other Ambulatory Visit: Payer: Self-pay

## 2018-09-01 DIAGNOSIS — K611 Rectal abscess: Principal | ICD-10-CM | POA: Insufficient documentation

## 2018-09-01 DIAGNOSIS — Z79899 Other long term (current) drug therapy: Secondary | ICD-10-CM | POA: Insufficient documentation

## 2018-09-01 DIAGNOSIS — Z923 Personal history of irradiation: Secondary | ICD-10-CM | POA: Insufficient documentation

## 2018-09-01 DIAGNOSIS — Z85831 Personal history of malignant neoplasm of soft tissue: Secondary | ICD-10-CM | POA: Insufficient documentation

## 2018-09-01 DIAGNOSIS — F17210 Nicotine dependence, cigarettes, uncomplicated: Secondary | ICD-10-CM | POA: Insufficient documentation

## 2018-09-01 HISTORY — PX: INCISION AND DRAINAGE ABSCESS: SHX5864

## 2018-09-01 HISTORY — PX: RECTAL EXAM UNDER ANESTHESIA: SHX6399

## 2018-09-01 SURGERY — EXAM UNDER ANESTHESIA, RECTUM
Anesthesia: General

## 2018-09-01 MED ORDER — MIDAZOLAM HCL 2 MG/2ML IJ SOLN
INTRAMUSCULAR | Status: AC
  Filled 2018-09-01: qty 2

## 2018-09-01 MED ORDER — BUPIVACAINE LIPOSOME 1.3 % IJ SUSP
INTRAMUSCULAR | Status: AC
  Filled 2018-09-01: qty 20

## 2018-09-01 MED ORDER — LACTATED RINGERS IV SOLN
INTRAVENOUS | Status: DC
  Administered 2018-09-01: 10:00:00 via INTRAVENOUS

## 2018-09-01 MED ORDER — ONDANSETRON HCL 4 MG PO TABS: 4.0000 mg | ORAL_TABLET | Freq: Three times a day (TID) | ORAL | 1 refills | Status: AC | PRN

## 2018-09-01 MED ORDER — PROMETHAZINE HCL 25 MG/ML IJ SOLN: 6.2500 mg | INTRAMUSCULAR | Status: DC | PRN

## 2018-09-01 MED ORDER — HYDROMORPHONE HCL 1 MG/ML IJ SOLN: 0.2500 mg | INTRAMUSCULAR | Status: DC | PRN

## 2018-09-01 MED ORDER — IBUPROFEN 800 MG PO TABS: 800.0000 mg | ORAL_TABLET | Freq: Three times a day (TID) | ORAL | 0 refills | Status: AC | PRN

## 2018-09-01 MED ORDER — DOCUSATE SODIUM 100 MG PO CAPS: 100.0000 mg | ORAL_CAPSULE | Freq: Two times a day (BID) | ORAL | 0 refills | Status: AC | PRN

## 2018-09-01 MED ORDER — BUPIVACAINE-EPINEPHRINE (PF) 0.5% -1:200000 IJ SOLN
INTRAMUSCULAR | Status: AC
  Filled 2018-09-01: qty 30

## 2018-09-01 MED ORDER — FENTANYL CITRATE (PF) 100 MCG/2ML IJ SOLN
INTRAMUSCULAR | Status: DC | PRN
  Administered 2018-09-01 (×2): 50 ug via INTRAVENOUS

## 2018-09-01 MED ORDER — LIDOCAINE-EPINEPHRINE 1 %-1:100000 IJ SOLN
INTRAMUSCULAR | Status: AC
  Filled 2018-09-01: qty 1

## 2018-09-01 MED ORDER — CEFAZOLIN SODIUM-DEXTROSE 2-4 GM/100ML-% IV SOLN
INTRAVENOUS | Status: AC
  Filled 2018-09-01: qty 100

## 2018-09-01 MED ORDER — HYDROMORPHONE HCL 1 MG/ML IJ SOLN
INTRAMUSCULAR | Status: AC
  Filled 2018-09-01: qty 1

## 2018-09-01 MED ORDER — MIDAZOLAM HCL 2 MG/2ML IJ SOLN
INTRAMUSCULAR | Status: DC | PRN
  Administered 2018-09-01: 2 mg via INTRAVENOUS

## 2018-09-01 MED ORDER — BUPIVACAINE-EPINEPHRINE (PF) 0.5% -1:200000 IJ SOLN
INTRAMUSCULAR | Status: DC | PRN
  Administered 2018-09-01: 15 mL via PERINEURAL

## 2018-09-01 MED ORDER — ACETAMINOPHEN 325 MG PO TABS: 650.0000 mg | ORAL_TABLET | Freq: Three times a day (TID) | ORAL | 0 refills | Status: AC | PRN

## 2018-09-01 MED ORDER — LACTATED RINGERS IV SOLN
INTRAVENOUS | Status: DC | PRN
  Administered 2018-09-01: 11:00:00 via INTRAVENOUS

## 2018-09-01 MED ORDER — ACETAMINOPHEN 500 MG PO TABS
ORAL_TABLET | ORAL | Status: AC
  Filled 2018-09-01: qty 2

## 2018-09-01 MED ORDER — PROPOFOL 10 MG/ML IV BOLUS
INTRAVENOUS | Status: AC
  Filled 2018-09-01: qty 20

## 2018-09-01 MED ORDER — ACETAMINOPHEN 500 MG PO TABS
1000.0000 mg | ORAL_TABLET | ORAL | Status: AC
  Administered 2018-09-01: 1000 mg via ORAL

## 2018-09-01 MED ORDER — BUPIVACAINE HCL (PF) 0.5 % IJ SOLN
INTRAMUSCULAR | Status: AC
  Filled 2018-09-01: qty 30

## 2018-09-01 MED ORDER — CHLORHEXIDINE GLUCONATE CLOTH 2 % EX PADS: 6.0000 | MEDICATED_PAD | Freq: Once | CUTANEOUS | Status: DC

## 2018-09-01 MED ORDER — LIDOCAINE HCL (CARDIAC) PF 100 MG/5ML IV SOSY
PREFILLED_SYRINGE | INTRAVENOUS | Status: DC | PRN
  Administered 2018-09-01: 100 mg via INTRAVENOUS

## 2018-09-01 MED ORDER — PROPOFOL 10 MG/ML IV BOLUS
INTRAVENOUS | Status: DC | PRN
  Administered 2018-09-01: 200 mg via INTRAVENOUS
  Administered 2018-09-01: 80 mg via INTRAVENOUS
  Administered 2018-09-01: 60 mg via INTRAVENOUS

## 2018-09-01 MED ORDER — ONDANSETRON HCL 4 MG/2ML IJ SOLN
INTRAMUSCULAR | Status: DC | PRN
  Administered 2018-09-01: 4 mg via INTRAVENOUS

## 2018-09-01 MED ORDER — FENTANYL CITRATE (PF) 100 MCG/2ML IJ SOLN
INTRAMUSCULAR | Status: AC
  Filled 2018-09-01: qty 2

## 2018-09-01 MED ORDER — HYDROMORPHONE HCL 1 MG/ML IJ SOLN
INTRAMUSCULAR | Status: DC | PRN
  Administered 2018-09-01: 0.5 mg via INTRAVENOUS

## 2018-09-01 SURGICAL SUPPLY — 42 items
BLADE SURG 15 STRL LF DISP TIS (BLADE) ×1 IMPLANT
BLADE SURG 15 STRL SS (BLADE) ×2
BNDG GAUZE 4.5X4.1 6PLY STRL (MISCELLANEOUS) IMPLANT
BRIEF STRETCH MATERNITY 2XLG (MISCELLANEOUS) ×3 IMPLANT
CANISTER SUCT 1200ML W/VALVE (MISCELLANEOUS) ×3 IMPLANT
COVER WAND RF STERILE (DRAPES) ×3 IMPLANT
DRAIN PENROSE 1/4X12 LTX (DRAIN) IMPLANT
DRAIN PENROSE 5/8X12 LTX STRL (DRAIN) IMPLANT
DRAPE LAPAROTOMY 77X122 PED (DRAPES) ×3 IMPLANT
DRAPE PERI LITHO V/GYN (MISCELLANEOUS) ×3 IMPLANT
ELECT REM PT RETURN 9FT ADLT (ELECTROSURGICAL) ×3
ELECTRODE REM PT RTRN 9FT ADLT (ELECTROSURGICAL) ×1 IMPLANT
GAUZE SPONGE 4X4 12PLY STRL (GAUZE/BANDAGES/DRESSINGS) ×3 IMPLANT
GLOVE BIO SURGEON STRL SZ 6.5 (GLOVE) ×2 IMPLANT
GLOVE BIO SURGEONS STRL SZ 6.5 (GLOVE) ×1
GLOVE INDICATOR 7.0 STRL GRN (GLOVE) ×3 IMPLANT
GLOVE SURG SYN 7.0 (GLOVE) ×3 IMPLANT
GLOVE SURG SYN 7.0 PF PI (GLOVE) ×1 IMPLANT
GOWN STRL REUS W/ TWL LRG LVL3 (GOWN DISPOSABLE) ×2 IMPLANT
GOWN STRL REUS W/TWL LRG LVL3 (GOWN DISPOSABLE) ×4
KIT TURNOVER CYSTO (KITS) ×3 IMPLANT
LABEL OR SOLS (LABEL) ×3 IMPLANT
LOOP RED MAXI  1X406MM (MISCELLANEOUS)
LOOP VESSEL MAXI 1X406 RED (MISCELLANEOUS) IMPLANT
NDL HYPO 25X1 1.5 SAFETY (NEEDLE) ×1 IMPLANT
NDL SAFETY ECLIPSE 18X1.5 (NEEDLE) ×1 IMPLANT
NEEDLE HYPO 18GX1.5 SHARP (NEEDLE) ×2
NEEDLE HYPO 25X1 1.5 SAFETY (NEEDLE) ×3 IMPLANT
NS IRRIG 500ML POUR BTL (IV SOLUTION) ×3 IMPLANT
PACK BASIN MINOR ARMC (MISCELLANEOUS) ×3 IMPLANT
PAD ABD DERMACEA PRESS 5X9 (GAUZE/BANDAGES/DRESSINGS) ×6 IMPLANT
PAD PREP 24X41 OB/GYN DISP (PERSONAL CARE ITEMS) ×3 IMPLANT
SCRUB POVIDONE IODINE 4 OZ (MISCELLANEOUS) ×3 IMPLANT
SPONGE LAP 18X18 RF (DISPOSABLE) IMPLANT
SURGILUBE 2OZ TUBE FLIPTOP (MISCELLANEOUS) ×3 IMPLANT
SUT CHROMIC 3 0 SH 27 (SUTURE) IMPLANT
SUT SILK 0 SH 30 (SUTURE) IMPLANT
SUT VIC AB 3-0 SH 27 (SUTURE)
SUT VIC AB 3-0 SH 27X BRD (SUTURE) IMPLANT
SYR 10ML LL (SYRINGE) ×6 IMPLANT
SYR BULB IRRIG 60ML STRL (SYRINGE) ×3 IMPLANT
TOWEL OR 17X26 4PK STRL BLUE (TOWEL DISPOSABLE) ×3 IMPLANT

## 2018-09-01 NOTE — Transfer of Care (Signed)
Immediate Anesthesia Transfer of Care Note  Patient: Lance Bentley  Procedure(s) Performed: RECTAL EXAM UNDER ANESTHESIA (N/A ) POSSIBLE INCISION AND DRAINAGE PERIRECTAL ABSCESS (N/A )  Patient Location: PACU  Anesthesia Type:General  Level of Consciousness: awake, alert , oriented and patient cooperative  Airway & Oxygen Therapy: Patient Spontanous Breathing and Patient connected to nasal cannula oxygen  Post-op Assessment: Report given to RN, Post -op Vital signs reviewed and stable and Patient moving all extremities X 4  Post vital signs: stable  Last Vitals:  Vitals Value Taken Time  BP 124/93 09/01/2018 11:38 AM  Temp    Pulse 73 09/01/2018 11:40 AM  Resp 22 09/01/2018 11:40 AM  SpO2 100 % 09/01/2018 11:40 AM  Vitals shown include unvalidated device data.  Last Pain:  Vitals:   09/01/18 0914  TempSrc: Tympanic  PainSc: 9       Patients Stated Pain Goal: 3 (09/73/53 2992)  Complications: No apparent anesthesia complications

## 2018-09-01 NOTE — Anesthesia Procedure Notes (Signed)
Procedure Name: LMA Insertion Date/Time: 09/01/2018 10:56 AM Performed by: Willette Alma, CRNA Pre-anesthesia Checklist: Patient identified, Patient being monitored, Timeout performed, Emergency Drugs available and Suction available Patient Re-evaluated:Patient Re-evaluated prior to induction Oxygen Delivery Method: Circle system utilized Preoxygenation: Pre-oxygenation with 100% oxygen Induction Type: IV induction Ventilation: Mask ventilation without difficulty LMA: LMA inserted LMA Size: 5.0 Tube type: Oral Number of attempts: 1 Placement Confirmation: positive ETCO2 and breath sounds checked- equal and bilateral Tube secured with: Tape Dental Injury: Teeth and Oropharynx as per pre-operative assessment  Comments: Atraumatic LMA insertion x 1 attempt. Lips and teeth as before.

## 2018-09-01 NOTE — Anesthesia Post-op Follow-up Note (Signed)
Anesthesia QCDR form completed.        

## 2018-09-01 NOTE — Anesthesia Preprocedure Evaluation (Addendum)
Anesthesia Evaluation  Patient identified by MRN, date of birth, ID band Patient awake    Reviewed: Allergy & Precautions, H&P , NPO status , Patient's Chart, lab work & pertinent test results  Airway Mallampati: II       Dental  (+) Chipped   Pulmonary neg pulmonary ROS, Current Smoker,           Cardiovascular negative cardio ROS       Neuro/Psych negative neurological ROS  negative psych ROS   GI/Hepatic negative GI ROS, Neg liver ROS, GERD  Controlled,  Endo/Other  negative endocrine ROS  Renal/GU      Musculoskeletal   Abdominal   Peds  Hematology negative hematology ROS (+)   Anesthesia Other Findings Past Medical History: No date: GERD (gastroesophageal reflux disease) 2014: Spindle cell carcinoma ([place])     Comment:  RIGHT SHOULDER  Past Surgical History: April 19, 2013: dernatofibroma protuberans; Left     Comment:  pT2a,Nx; excised to 2 cm negative margins, partial               external beam radiation treatment. 07-24-13: SKIN LESION EXCISION 04/19/13: wide excision; Right     Comment:  right back excision leiomyosarcome  BMI    Body Mass Index:  31.19 kg/m      Reproductive/Obstetrics negative OB ROS                            Anesthesia Physical Anesthesia Plan  ASA: II  Anesthesia Plan: General ETT   Post-op Pain Management:    Induction:   PONV Risk Score and Plan: Ondansetron, Dexamethasone, Midazolam and Treatment may vary due to age or medical condition  Airway Management Planned:   Additional Equipment:   Intra-op Plan:   Post-operative Plan:   Informed Consent: I have reviewed the patients History and Physical, chart, labs and discussed the procedure including the risks, benefits and alternatives for the proposed anesthesia with the patient or authorized representative who has indicated his/her understanding and acceptance.   Dental Advisory  Given  Plan Discussed with: Anesthesiologist, CRNA and Surgeon  Anesthesia Plan Comments:         Anesthesia Quick Evaluation

## 2018-09-01 NOTE — Discharge Instructions (Signed)

## 2018-09-01 NOTE — H&P (Signed)
Subjective:   CC: Perirectal abscess [K61.1]  HPI: Lance Bentley is a 38 y.o. male who was referred by Ronn Melena, MD for evaluation of above. First noted 5 days ago. Symptoms include: Pain is sharp, localized to area. Exacerbated by pressure. Alleviated by nothing specific. Associated with increasing lump and erythema in perirectal region, no discharge.   Past Medical History: has a past medical history of History of cancer and Tobacco use. 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 No current followup  Past Surgical History: has a past surgical history that includes carcinoma removed (Right, 2014).  Family History: family history includes Melanoma in his maternal grandmother.  Social History: reports that he has been smoking cigarettes. He has been smoking about 1.00 pack per day. He has quit using smokeless tobacco. He reports current alcohol use of about 12.0 standard drinks of alcohol per week. He reports current drug use.  Current Medications: has a current medication list which includes the following prescription(s): amoxicillin-clavulanate, atenolol, ibuprofen, and tramadol.  Allergies:  Allergies  Allergen Reactions  . Nickel Rash   ROS:  A 15 point review of systems was performed and pertinent positives and negatives noted in HPI  Objective:    BP 141/81  Pulse (!) 120  Temp 36.9 C (98.5 F) (Oral)  Ht 182.9 cm (6')  Wt (!) 104.6 kg (230 lb 9.6 oz)  BMI 31.28 kg/m   Constitutional : alert, appears stated age, cooperative and no distress  Lymphatics/Throat: no asymmetry, masses, or scars  Respiratory: clear to auscultation bilaterally  Cardiovascular: regular rate and rhythm  Gastrointestinal: soft, non-tender; bowel sounds normal; no masses, no organomegaly.  Musculoskeletal: Steady gait and movement  Skin: Cool and moist  Psychiatric: Normal affect, non-agitated, not confused  Rectal: External exam noted to have induration,  erythematous area on right gluteal area extending toward but not involving anal verge. No obvious fluctuance or discharge, but tender to touch. Unable to perform DRE due to discomfort.   LABS:  n/a   RADS: n/a  Assessment:    Perirectal abscess [K61.1]  Smoking  Plan:    1. Perirectal abscess [K61.1] Discussed surgical I&D. Alternatives include continued observation. Benefits include possible symptom relief, pathologic evaluation, improved cosmesis. Discussed the risk of surgery including recurrence, chronic pain, post-op infxn, poor cosmesis, poor/delayed wound healing, and possible re-operation to address said risks. The risks of general anesthetic, if used, includes MI, CVA, sudden death or even reaction to anesthetic medications also discussed.  Typical post-op recovery time of with possible activity restrictions, prolonged wound care were also discussed.  The patient verbalized understanding and all questions were answered to the patient's satisfaction.  2. Due to location and extent, I&D in OR will be best. Pt however requested to postpone surgical I&D until a couple days later so he has time to finish a project at work. He understands that abx likely will not resolve the issue and the current symptoms can continue to worsen. ED precautions discussed. Case scheduled.  We also discussed his smoking habits place him at increase risk for above complications. Pt states he will quit today

## 2018-09-01 NOTE — Progress Notes (Signed)
Informed Dr. Lysle Pearl pt requests to d/c home instead of OBS admit.  Dr. Lysle Pearl at bedside discussed with pt.  Lysle Pearl will change order to D/C home.

## 2018-09-01 NOTE — Op Note (Signed)
Preoperative diagnosis: gluteal abscess Postoperative diagnosis: perirectal abscess  Procedure: Incision and drainage of perirectal abscess  Anesthesia: LMA  Surgeon: Lysle Pearl  Wound Classification: Clean Contaminated  Indications: Patient is a 38 y.o. male  presented with above.  See H&P for further details.  Specimen: wound culture  Complications: None  Estimated Blood Loss: 50mL  Findings:  1. 6cm x 4cm perirectal abscess 2. purulent secretions drained and cultured 3. Adequate hemostasis.   Description of procedure: The patient was placed in the high lithotomy position and LMA anesthesia was induced. The area was prepped and draped in the usual sterile fashion. A timeout was completed verifying correct patient, procedure, site, positioning, and implant(s) and/or special equipment prior to beginning this procedure.  Local infused over planned incision site.  Inicision made and purulent secretions was drained once abscess cavity entered with blunt dissection. Cultures taken.  With a hemostat blunt dissection of septas performed to drain the abscess completely.  Palpation within the abscess cavity noted it to be lateral to the sphincter muscles but extending deeper to it.  Entire cavity measured 6cm x 4cmx 4cm. The wound then irrigated, hemostasis confirmed and then packed with an iodine packing, dressed with abd pads, fluffs and secured with paper tape.  The patient tolerated the procedure well, LMA removed, and was taken to the postanesthesia care unit in satisfactory condition. Sponge and instrument count correct at end of procedure

## 2018-09-01 NOTE — Interval H&P Note (Signed)
History and Physical Interval Note:  09/01/2018 10:43 AM  Lance Bentley  has presented today for surgery, with the diagnosis of PERIRECTAL ABSCESS  The various methods of treatment have been discussed with the patient and family. After consideration of risks, benefits and other options for treatment, the patient has consented to  Procedure(s): RECTAL EXAM UNDER ANESTHESIA (N/A) POSSIBLE INCISION AND DRAINAGE PERIRECTAL ABSCESS (N/A) as a surgical intervention .  The patient's history has been reviewed, patient examined, no change in status, stable for surgery.  I have reviewed the patient's chart and labs.  Questions were answered to the patient's satisfaction.     Lance Bentley

## 2018-09-01 NOTE — Anesthesia Postprocedure Evaluation (Signed)
Anesthesia Post Note  Patient: Lance Bentley  Procedure(s) Performed: RECTAL EXAM UNDER ANESTHESIA (N/A ) POSSIBLE INCISION AND DRAINAGE PERIRECTAL ABSCESS (N/A )  Patient location during evaluation: PACU Anesthesia Type: General Level of consciousness: awake and alert Pain management: pain level controlled Vital Signs Assessment: post-procedure vital signs reviewed and stable Respiratory status: spontaneous breathing, nonlabored ventilation, respiratory function stable and patient connected to nasal cannula oxygen Cardiovascular status: blood pressure returned to baseline and stable Postop Assessment: no apparent nausea or vomiting Anesthetic complications: no     Last Vitals:  Vitals:   09/01/18 1231 09/01/18 1248  BP:  123/77  Pulse: 77 60  Resp: 13 16  Temp:  36.5 C  SpO2: 99% 99%    Last Pain:  Vitals:   09/01/18 1248  TempSrc: Temporal  PainSc: 0-No pain                 Durenda Hurt

## 2018-09-04 NOTE — Discharge Summary (Signed)
Physician Discharge Summary  Patient ID: Lance Bentley MRN: [id number] DOB/AGE: March 12, 1981 38 y.o.  Admit date: 09/01/2018 Discharge date: 09/04/2018  Admission Diagnoses: perirectal abscess  Discharge Diagnoses:  Same as above  Discharged Condition: good  Hospital Course: underwent urgent perirectal I&D in OR.  Please see op note for details.  Initial plan was overnight obs but patient requested d/c due to self pay status.  ED precautions and f/u discussed and he was discharged from PACU after uneventful recovery from procedure.  Consults: None  Discharge Exam: Blood pressure 123/77, pulse 60, temperature 97.7 F (36.5 C), temperature source Temporal, resp. rate 16, height 6' (1.829 m), weight 104.3 kg, SpO2 99 %. General appearance: alert, cooperative and no distress Incision/Wound: dressing from surgery intact  Disposition:  Discharge disposition: 01-Home or Self Care       Discharge Instructions    Discharge patient   Complete by:  As directed    Discharge disposition:  01-Home or Self Care   Discharge patient date:  09/01/2018     Allergies as of 09/01/2018   No Known Allergies     Medication List    TAKE these medications   acetaminophen 325 MG tablet Commonly known as:  TYLENOL Take 2 tablets (650 mg total) by mouth every 8 (eight) hours as needed for up to 30 days for mild pain.   docusate sodium 100 MG capsule Commonly known as:  COLACE Take 1 capsule (100 mg total) by mouth 2 (two) times daily as needed for up to 10 days for mild constipation.   ibuprofen 800 MG tablet Commonly known as:  ADVIL,MOTRIN Take 1 tablet (800 mg total) by mouth every 8 (eight) hours as needed for mild pain or moderate pain.   ondansetron 4 MG tablet Commonly known as:  ZOFRAN Take 1 tablet (4 mg total) by mouth every 8 (eight) hours as needed for nausea or vomiting.   ranitidine 150 MG capsule Commonly known as:  ZANTAC Take 150 mg by mouth every evening.       Follow-up Information    Benjamine Sprague, DO Follow up on 09/04/2018.   Specialty:  Surgery Why:  10:30 am Contact information: [address] [place] [address] [phone]        Follow up On 09/04/2018.   Why:  for wound packing removal           Total time spent arranging discharge was >51min. Signed: Benjamine Sprague 09/04/2018, 12:28 PM

## 2018-09-07 LAB — AEROBIC/ANAEROBIC CULTURE W GRAM STAIN (SURGICAL/DEEP WOUND)

## 2019-01-28 ENCOUNTER — Emergency Department: Payer: Self-pay

## 2019-01-28 ENCOUNTER — Emergency Department
Admission: EM | Admit: 2019-01-28 | Discharge: 2019-01-28 | Disposition: A | Discharge status: Home / Self Care | Payer: Self-pay | Attending: Emergency Medicine | Admitting: Emergency Medicine

## 2019-01-28 ENCOUNTER — Other Ambulatory Visit: Payer: Self-pay

## 2019-01-28 ENCOUNTER — Encounter: Payer: Self-pay | Admitting: Emergency Medicine

## 2019-01-28 DIAGNOSIS — S91111A Laceration without foreign body of right great toe without damage to nail, initial encounter: Secondary | ICD-10-CM | POA: Insufficient documentation

## 2019-01-28 DIAGNOSIS — Y999 Unspecified external cause status: Secondary | ICD-10-CM | POA: Insufficient documentation

## 2019-01-28 DIAGNOSIS — X58XXXA Exposure to other specified factors, initial encounter: Secondary | ICD-10-CM | POA: Insufficient documentation

## 2019-01-28 DIAGNOSIS — Y929 Unspecified place or not applicable: Secondary | ICD-10-CM | POA: Insufficient documentation

## 2019-01-28 DIAGNOSIS — Y939 Activity, unspecified: Secondary | ICD-10-CM | POA: Insufficient documentation

## 2019-01-28 DIAGNOSIS — Z79899 Other long term (current) drug therapy: Secondary | ICD-10-CM | POA: Insufficient documentation

## 2019-01-28 DIAGNOSIS — F17200 Nicotine dependence, unspecified, uncomplicated: Secondary | ICD-10-CM | POA: Insufficient documentation

## 2019-01-28 MED ORDER — SULFAMETHOXAZOLE-TRIMETHOPRIM 800-160 MG PO TABS: 1.0000 | ORAL_TABLET | Freq: Two times a day (BID) | ORAL | 0 refills | Status: AC

## 2019-01-28 MED ORDER — HYDROCODONE-ACETAMINOPHEN 5-325 MG PO TABS: 1.0000 | ORAL_TABLET | ORAL | 0 refills | Status: AC | PRN

## 2019-01-28 NOTE — ED Notes (Signed)

## 2019-01-28 NOTE — ED Triage Notes (Signed)
Pt to ED via POV stating that he was walking backwards on the deck, pt pt caught his toe on a step and says that his toenail bent backwards and he got wood stuck in his foot. Pt states that his dad was able to get most of the wood out of his toe. Pt is in NAD

## 2019-01-28 NOTE — ED Notes (Signed)
First Nurse Note: Pt to ED via POV c/o toe injury. Pt is in NAD

## 2019-01-28 NOTE — ED Provider Notes (Signed)
[facility] Emergency Department Provider Note  ____________________________________________  Time seen: Approximately 6:04 PM  I have reviewed the triage vital signs and the nursing notes.   HISTORY  Chief Complaint Toe Injury    HPI Lance Bentley is a 38 y.o. male who presents the emergency department for evaluation of injury to the right foot.  Patient reports that he was outside his house, stretching out something to carry out in the sun.  Patient reports that he was walking backwards on his deck, missed a step and caught his barefoot on the edge of the step.  Patient sustained a laceration under the MTP joint on the plantar aspect of his foot.  Patient had grass, dirt, small splinters.  Patient reports that the majority of this was cleaned out at home prior to arrival but he is concerned that given the nature of the injury there could be retained foreign body or need for antibiotics.  No other injury or complaint.         Past Medical History:  Diagnosis Date  . GERD (gastroesophageal reflux disease)   . 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 Perirectal abscess 09/01/2018  . 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 Leiomyosarcoma ([place]) 04/04/2013  . Mass on back 03/02/2013  . 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 dernatofibroma protuberans Left April 19, 2013   pT2a,Nx; excised to 2 cm negative margins, partial external beam radiation treatment.  . INCISION AND DRAINAGE ABSCESS N/A 09/01/2018   Procedure: POSSIBLE INCISION AND DRAINAGE PERIRECTAL ABSCESS;  Surgeon: Benjamine Sprague, DO;  Location: [facility] ORS;  Service: General;  Laterality: N/A;  . RECTAL EXAM UNDER ANESTHESIA N/A 09/01/2018   Procedure: RECTAL EXAM UNDER ANESTHESIA;  Surgeon: Benjamine Sprague, DO;  Location: [facility] ORS;  Service:  General;  Laterality: N/A;  . SKIN LESION EXCISION  07-24-13  . wide excision Right 04/19/13   right back excision leiomyosarcome    Prior to Admission medications   Medication Sig Start Date End Date Taking? Authorizing Provider  HYDROcodone-acetaminophen (NORCO/VICODIN) 5-325 MG tablet Take 1 tablet by mouth every 4 (four) hours as needed for moderate pain. 01/28/19   Cuthriell, Charline Bills, PA-C  ibuprofen (ADVIL,MOTRIN) 800 MG tablet Take 1 tablet (800 mg total) by mouth every 8 (eight) hours as needed for mild pain or moderate pain. 09/01/18   Lysle Pearl, Isami, DO  ondansetron (ZOFRAN) 4 MG tablet Take 1 tablet (4 mg total) by mouth every 8 (eight) hours as needed for nausea or vomiting. 09/01/18 09/01/19  Lysle Pearl, Isami, DO  ranitidine (ZANTAC) 150 MG capsule Take 150 mg by mouth every evening.    [provider]  sulfamethoxazole-trimethoprim (BACTRIM DS) 800-160 MG tablet Take 1 tablet by mouth 2 (two) times daily. 01/28/19   Cuthriell, Charline Bills, PA-C    Allergies Patient has no known allergies.  No family history on file.  Social History Social History   Tobacco Use  . Smoking status: Current Every Day Smoker    Packs/day: 0.50    Years: 10.00    Pack years: 5.00  . Smokeless tobacco: Never Used  Substance Use Topics  . Alcohol use: Yes  . Drug use: No     Review of Systems  Constitutional: No fever/chills Eyes: No visual changes.  Cardiovascular: no chest pain. Respiratory: no cough. No  SOB. Gastrointestinal: No abdominal pain.  No nausea, no vomiting.  Musculoskeletal: Negative for musculoskeletal pain. Skin: Injury/open wound to the plantar aspect of the foot under the MTP joint.  Possible retained foreign body. Neurological: Negative for headaches, focal weakness or numbness. 10-point ROS otherwise negative.  ____________________________________________   PHYSICAL EXAM:  VITAL SIGNS: ED Triage Vitals  Enc Vitals Group     BP 01/28/19 1746 128/83     Pulse  Rate 01/28/19 1746 79     Resp 01/28/19 1746 16     Temp 01/28/19 1746 98.2 F (36.8 C)     Temp Source 01/28/19 1746 Oral     SpO2 01/28/19 1746 96 %     Weight 01/28/19 1744 230 lb (104.3 kg)     Height 01/28/19 1744 6\' 1"  (1.854 m)     Head Circumference --      Peak Flow --      Pain Score 01/28/19 1743 7     Pain Loc --      Pain Edu? --      Excl. in [place]? --      Constitutional: Alert and oriented. Well appearing and in no acute distress. Eyes: Conjunctivae are normal. PERRL. EOMI. Head: Atraumatic. Neck: No stridor.    Cardiovascular: Normal rate, regular rhythm. Normal S1 and S2.  Good peripheral circulation. Respiratory: Normal respiratory effort without tachypnea or retractions. Lungs CTAB. Good air entry to the bases with no decreased or absent breath sounds. Musculoskeletal: Full range of motion to all extremities. No gross deformities appreciated.  Examination of the right foot reveals ragged, open laceration to the MTP joint of the great toe of the right foot.  This is on the plantar aspect.  There is missing tissue appreciated.  No active bleeding.  Patient has thoroughly cleansed the area prior to arrival and no obvious retained foreign body is appreciated.  Good range of motion to the toe.  Sensation and cap refill intact. Neurologic:  Normal speech and language. No gross focal neurologic deficits are appreciated.  Skin:  Skin is warm, dry and intact. No rash noted. Psychiatric: Mood and affect are normal. Speech and behavior are normal. Patient exhibits appropriate insight and judgement.   ____________________________________________   LABS (all labs ordered are listed, but only abnormal results are displayed)  Labs Reviewed - No data to display ____________________________________________  EKG   ____________________________________________  RADIOLOGY I personally viewed and evaluated these images as part of my medical decision making, as well as reviewing  the written report by the radiologist.  I concur with radiologist finding of no acute osseous abnormality visible radiopaque foreign body  Dg Foot Complete Right  Result Date: 01/28/2019 CLINICAL DATA:  Injury to right great toe. EXAM: RIGHT FOOT COMPLETE - 3+ VIEW COMPARISON:  10/04/2006 FINDINGS: Soft tissue swelling about the first phalanx. No acute fracture or dislocation. No radiopaque foreign object. Overlap of toes on the lateral view. Degenerate changes of the first metatarsophalangeal joint with increase in dorsal osteophyte. IMPRESSION: No acute osseous abnormality. Electronically Signed   By: Abigail Miyamoto M.D.   On: 01/28/2019 18:23    ____________________________________________    PROCEDURES  Procedure(s) performed:    Procedures    Medications - No data to display   ____________________________________________   INITIAL IMPRESSION / ASSESSMENT AND PLAN / ED COURSE  Pertinent labs & imaging results that were available during my care of the patient were reviewed by me and considered in my medical decision making (see  chart for details).  Review of the [place] CSRS was performed in accordance of the [place] prior to dispensing any controlled drugs.           Patient's diagnosis is consistent with laceration to the great toe.  Patient presented to emergency department with an injury to the MTP joint of the great toe.  Patient sustained a ragged laceration to the plantar aspect of the toe.  This was unable to be closed due to missing epidermal tissue.  Area was thoroughly cleansed, soaked in Betadine.  A few small grass particles are removed.  No other visible retained foreign body.  Area is dressed, postop shoe given to limit range of motion.  Follow-up with primary care as needed.  Patient will be placed on antibiotics prophylactically.  Limited pain medication is also prescribed..  Patient is given ED precautions to return to the ED for any worsening or new  symptoms.     ____________________________________________  FINAL CLINICAL IMPRESSION(S) / ED DIAGNOSES  Final diagnoses:  Laceration of right great toe without foreign body present or damage to nail, initial encounter      NEW MEDICATIONS STARTED DURING THIS VISIT:  ED Discharge Orders         Ordered    sulfamethoxazole-trimethoprim (BACTRIM DS) 800-160 MG tablet  2 times daily     01/28/19 1842    HYDROcodone-acetaminophen (NORCO/VICODIN) 5-325 MG tablet  Every 4 hours PRN     01/28/19 1842              This chart was dictated using voice recognition software/Dragon. Despite best efforts to proofread, errors can occur which can change the meaning. Any change was purely unintentional.    Darletta Moll, PA-C 01/28/19 1843    Nena Polio, MD 01/28/19 [phone]

## 2019-07-01 IMAGING — DX RIGHT FOOT COMPLETE - 3+ VIEW
3 series · 3 of 3 positions shown · non-contrast
Comparison: 10/04/2006

CLINICAL DATA: Injury to right great toe.

EXAM:
RIGHT FOOT COMPLETE - 3+ VIEW

[foot ap]
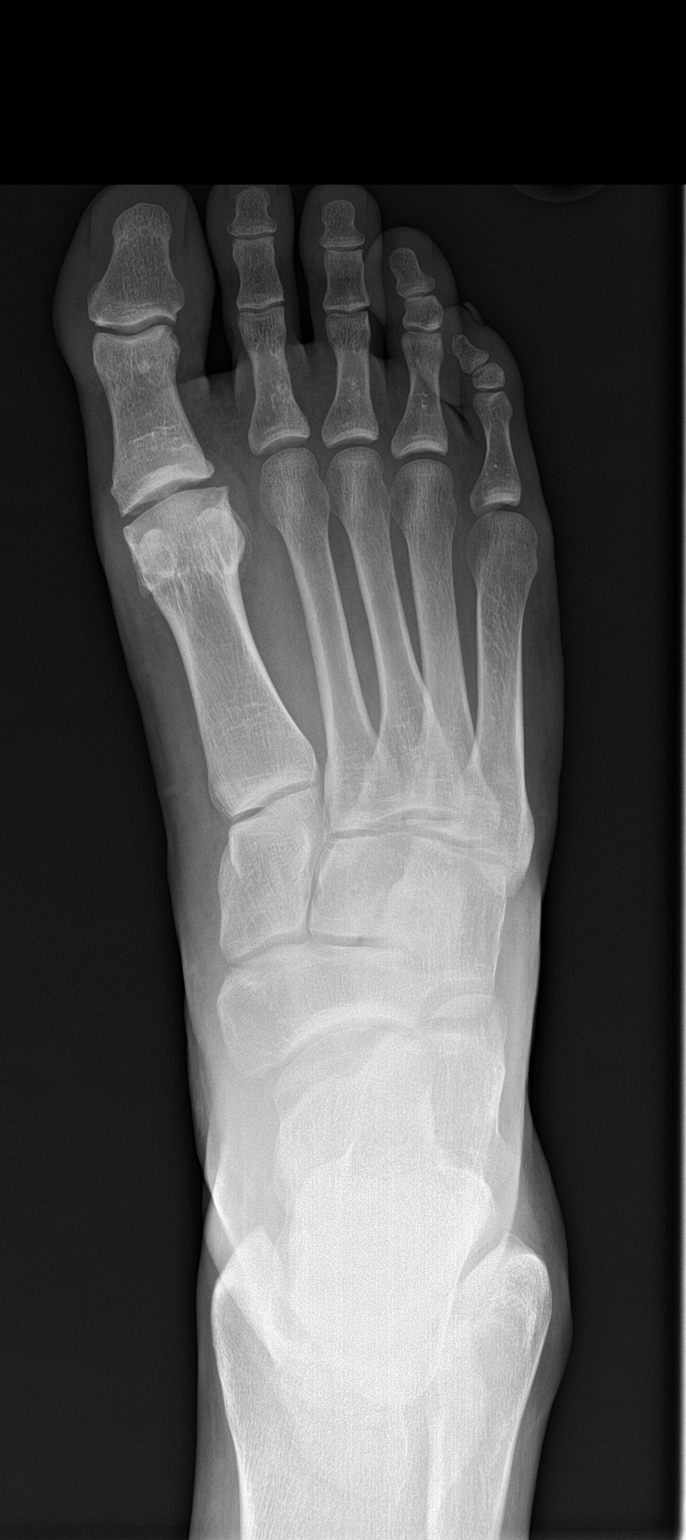

[foot obl]
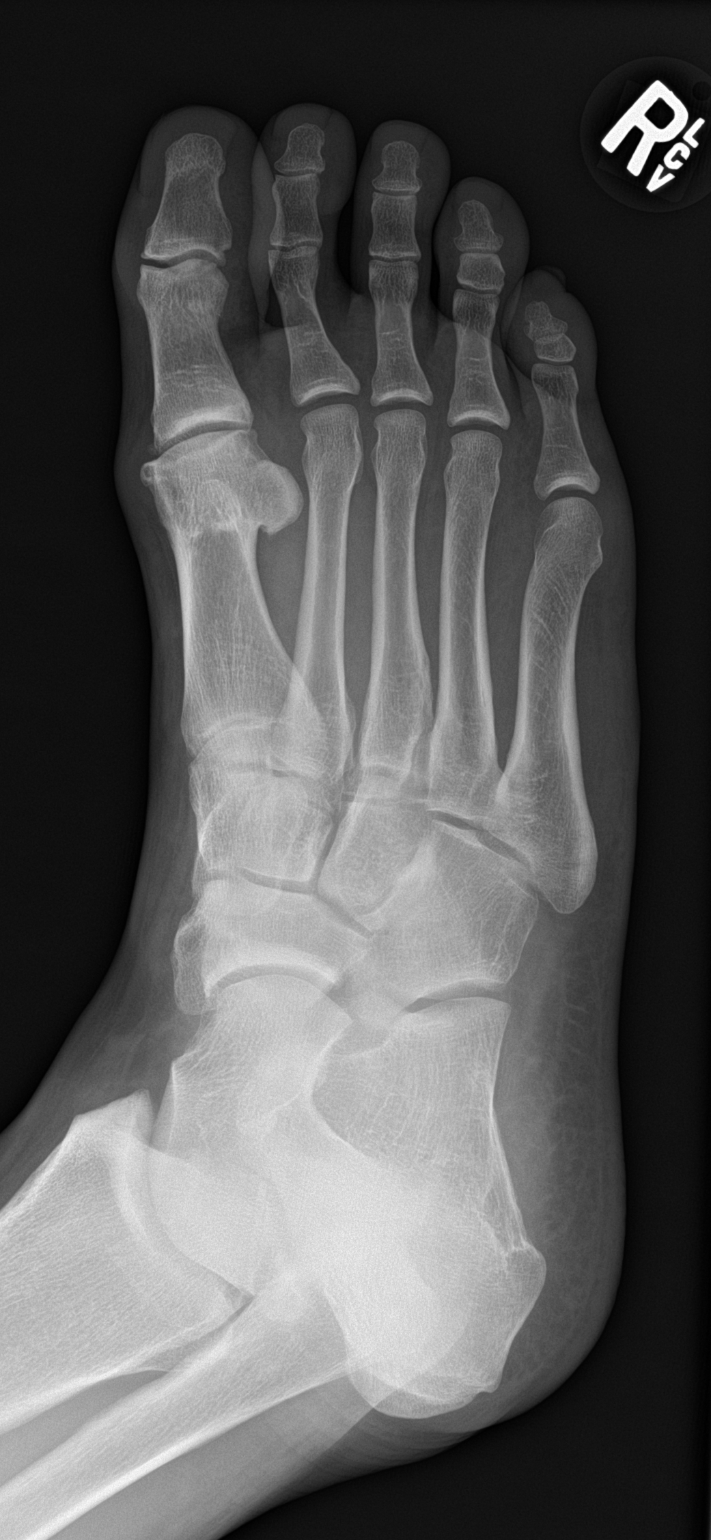

[foot lat]
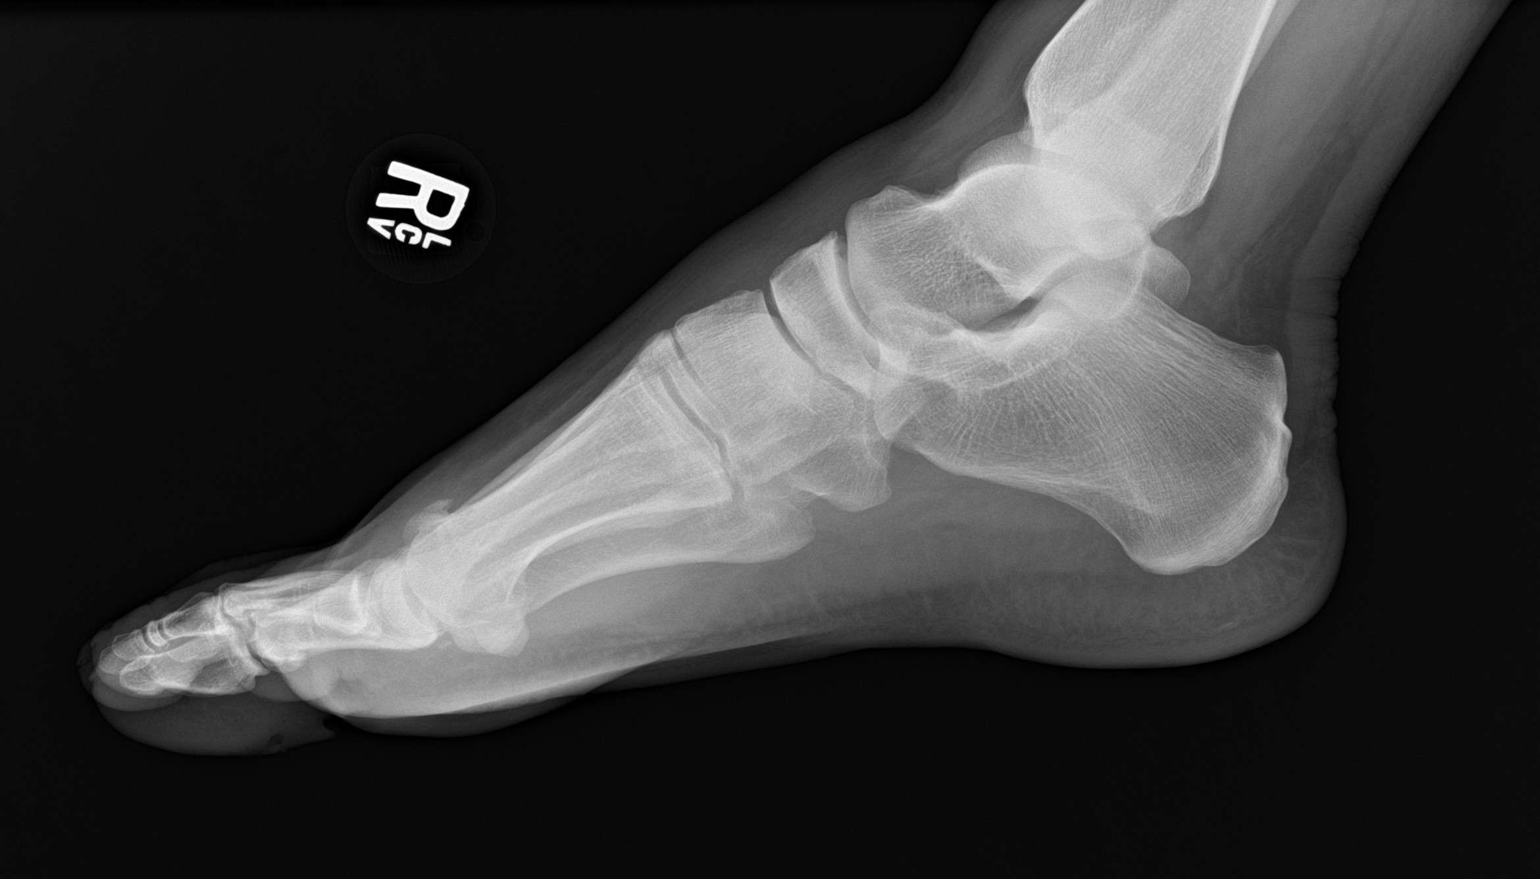

[3 of 3 positions shown; findings below may reference images not displayed]

FINDINGS: Soft tissue swelling about the first phalanx. No acute fracture or
dislocation. No radiopaque foreign object. Overlap of toes on the
lateral view. Degenerate changes of the first metatarsophalangeal
joint with increase in dorsal osteophyte.
IMPRESSION: No acute osseous abnormality.

## 2020-01-30 ENCOUNTER — Other Ambulatory Visit: Payer: Self-pay

## 2020-01-30 ENCOUNTER — Encounter: Payer: Self-pay | Admitting: Emergency Medicine

## 2020-01-30 ENCOUNTER — Emergency Department
Admission: EM | Admit: 2020-01-30 | Discharge: 2020-01-30 | Disposition: A | Discharge status: Home / Self Care | Payer: Self-pay | Attending: Emergency Medicine | Admitting: Emergency Medicine

## 2020-01-30 DIAGNOSIS — Z79899 Other long term (current) drug therapy: Secondary | ICD-10-CM | POA: Insufficient documentation

## 2020-01-30 DIAGNOSIS — Y93E5 Activity, floor mopping and cleaning: Secondary | ICD-10-CM | POA: Insufficient documentation

## 2020-01-30 DIAGNOSIS — Z23 Encounter for immunization: Secondary | ICD-10-CM | POA: Insufficient documentation

## 2020-01-30 DIAGNOSIS — Y999 Unspecified external cause status: Secondary | ICD-10-CM | POA: Insufficient documentation

## 2020-01-30 DIAGNOSIS — F17210 Nicotine dependence, cigarettes, uncomplicated: Secondary | ICD-10-CM | POA: Insufficient documentation

## 2020-01-30 DIAGNOSIS — Y929 Unspecified place or not applicable: Secondary | ICD-10-CM | POA: Insufficient documentation

## 2020-01-30 DIAGNOSIS — W298XXA Contact with other powered powered hand tools and household machinery, initial encounter: Secondary | ICD-10-CM | POA: Insufficient documentation

## 2020-01-30 DIAGNOSIS — S61412A Laceration without foreign body of left hand, initial encounter: Secondary | ICD-10-CM | POA: Insufficient documentation

## 2020-01-30 MED ORDER — IBUPROFEN 800 MG PO TABS
800.0000 mg | ORAL_TABLET | Freq: Once | ORAL | Status: AC
  Administered 2020-01-30: 800 mg via ORAL
  Filled 2020-01-30: qty 1

## 2020-01-30 MED ORDER — TETANUS-DIPHTH-ACELL PERTUSSIS 5-2.5-18.5 LF-MCG/0.5 IM SUSP
0.5000 mL | Freq: Once | INTRAMUSCULAR | Status: AC
  Administered 2020-01-30: 0.5 mL via INTRAMUSCULAR
  Filled 2020-01-30: qty 0.5

## 2020-01-30 NOTE — ED Triage Notes (Signed)
Patient ambulatory to triage with steady gait, without difficulty or distress noted, mask in place; pt reports at 7pm tonight he cut left hand with a grinder; approx 3" lac noted to palmar base of thumb with no active bleeding; gauze dressing applied

## 2020-01-30 NOTE — ED Provider Notes (Signed)
[facility] Emergency Department Provider Note  ____________________________________________  Time seen: Approximately 6:25 AM  I have reviewed the triage vital signs and the nursing notes.   HISTORY  Chief Complaint Laceration   HPI Lance Bentley is a 39 y.o. male presents for evaluation of hand laceration.  Patient was cleaning a grinder when he cut his left hand onto the blades.  Last tetanus shot was 2002.  Patient is complaining of severe throbbing pain that happened since the accident.  Denies any other injuries.   Past Medical History:  Diagnosis Date  . GERD (gastroesophageal reflux disease)   . 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 Perirectal abscess 09/01/2018  . 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 Leiomyosarcoma ([place]) 04/04/2013  . Mass on back 03/02/2013  . 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 dernatofibroma protuberans Left April 19, 2013   pT2a,Nx; excised to 2 cm negative margins, partial external beam radiation treatment.  . INCISION AND DRAINAGE ABSCESS N/A 09/01/2018   Procedure: POSSIBLE INCISION AND DRAINAGE PERIRECTAL ABSCESS;  Surgeon: Benjamine Sprague, DO;  Location: [facility] ORS;  Service: General;  Laterality: N/A;  . RECTAL EXAM UNDER ANESTHESIA N/A 09/01/2018   Procedure: RECTAL EXAM UNDER ANESTHESIA;  Surgeon: Benjamine Sprague, DO;  Location: [facility] ORS;  Service: General;  Laterality: N/A;  . SKIN LESION EXCISION  07-24-13  . wide excision Right 04/19/13   right back excision leiomyosarcome    Prior to Admission medications   Medication Sig Start Date End Date Taking? Authorizing Provider  HYDROcodone-acetaminophen (NORCO/VICODIN) 5-325 MG tablet Take 1 tablet by mouth every 4 (four) hours as needed for moderate pain. 01/28/19   Cuthriell, Charline Bills, PA-C    ibuprofen (ADVIL,MOTRIN) 800 MG tablet Take 1 tablet (800 mg total) by mouth every 8 (eight) hours as needed for mild pain or moderate pain. 09/01/18   Lysle Pearl, Isami, DO  ranitidine (ZANTAC) 150 MG capsule Take 150 mg by mouth every evening.    [provider]  sulfamethoxazole-trimethoprim (BACTRIM DS) 800-160 MG tablet Take 1 tablet by mouth 2 (two) times daily. 01/28/19   Cuthriell, Charline Bills, PA-C    Allergies Nickel  No family history on file.  Social History Social History   Tobacco Use  . Smoking status: Current Every Day Smoker    Packs/day: 0.50    Years: 10.00    Pack years: 5.00  . Smokeless tobacco: Never Used  Substance Use Topics  . Alcohol use: Yes  . Drug use: No    Review of Systems  Constitutional: Negative for fever. Eyes: Negative for visual changes. ENT: Negative for sore throat. Neck: No neck pain  Cardiovascular: Negative for chest pain. Respiratory: Negative for shortness of breath. Gastrointestinal: Negative for abdominal pain, vomiting or diarrhea. Genitourinary: Negative for dysuria. Musculoskeletal: Negative for back pain. Skin: Negative for rash. + hand laceration Neurological: Negative for headaches, weakness or numbness. Psych: No SI or HI  ____________________________________________   PHYSICAL EXAM:  VITAL SIGNS: ED Triage Vitals  Enc Vitals Group     BP 01/30/20 0011 (!) 146/91     Pulse Rate 01/30/20 0011 (!) 107     Resp 01/30/20 0011 18     Temp 01/30/20 0011 98.5 F (36.9 C)     Temp Source 01/30/20 0011 Oral  SpO2 01/30/20 0011 96 %     Weight 01/30/20 0020 215 lb (97.5 kg)     Height 01/30/20 0020 6' (1.829 m)     Head Circumference --      Peak Flow --      Pain Score 01/30/20 0019 8     Pain Loc --      Pain Edu? --      Excl. in [place]? --     Constitutional: Alert and oriented. Well appearing and in no apparent distress. HEENT:      Head: Normocephalic and atraumatic.         Eyes: Conjunctivae are  normal. Sclera is non-icteric.       Mouth/Throat: Mucous membranes are moist.       Neck: Supple with no signs of meningismus. Cardiovascular: Regular rate and rhythm.  Respiratory: Normal respiratory effort.  Musculoskeletal: 3cm laceration located over the 1st metacarpal area on the palm of the L hand, superficial involving the skin only, no subq tissue or tendon involvement, no active bleeding. Neurologic: Normal speech and language. Face is symmetric. Moving all extremities. No gross focal neurologic deficits are appreciated. Skin: Skin is warm, dry and intact. No rash noted. Psychiatric: Mood and affect are normal. Speech and behavior are normal.  ____________________________________________   LABS (all labs ordered are listed, but only abnormal results are displayed)  Labs Reviewed - No data to display ____________________________________________  EKG  none  ____________________________________________  RADIOLOGY  none  ____________________________________________   PROCEDURES  Procedure(s) performed:yes .Marland KitchenLaceration Repair  Date/Time: 01/30/2020 6:30 AM Performed by: Rudene Re, MD Authorized by: Rudene Re, MD   Consent:    Consent obtained:  Verbal   Consent given by:  Patient   Risks discussed:  Infection, pain, retained foreign body, poor cosmetic result and poor wound healing   Alternatives discussed:  No treatment Anesthesia (see MAR for exact dosages):    Anesthesia method:  None Laceration details:    Location:  Hand   Hand location:  L palm   Length (cm):  3 Repair type:    Repair type:  Simple Exploration:    Hemostasis achieved with:  Direct pressure   Wound exploration: entire depth of wound probed and visualized     Wound extent: no fascia violation noted, no foreign bodies/material noted, no muscle damage noted, no nerve damage noted, no tendon damage noted, no underlying fracture noted and no vascular damage noted      Contaminated: no   Treatment:    Area cleansed with:  Saline and Betadine   Amount of cleaning:  Extensive   Irrigation solution:  Sterile saline   Visualized foreign bodies/material removed: no   Skin repair:    Repair method: derma strips. Approximation:    Approximation:  Close Post-procedure details:    Dressing:  Sterile dressing   Patient tolerance of procedure:  Tolerated well, no immediate complications   Critical Care performed:  None ____________________________________________   INITIAL IMPRESSION / ASSESSMENT AND PLAN / ED COURSE  39 y.o. male presents for evaluation of hand laceration.  Patient with a 3 cm shallow laceration in the palm of the left hand involving the skin only.  Patient unable to tolerate lidocaine injection and suturing therefore derma strips were used with good approximation of the wound. Wound care discussed with patient.  Tetanus booster was administered.  Discussed my standard return precautions and follow-up with PCP.      _____________________________________________ Please note:  Patient was evaluated  in Emergency Department today for the symptoms described in the history of present illness. Patient was evaluated in the context of the global COVID-19 pandemic, which necessitated consideration that the patient might be at risk for infection with the SARS-CoV-2 virus that causes COVID-19. Institutional protocols and algorithms that pertain to the evaluation of patients at risk for COVID-19 are in a state of rapid change based on information released by regulatory bodies including the CDC and federal and state organizations. These policies and algorithms were followed during the patient's care in the ED.  Some ED evaluations and interventions may be delayed as a result of limited staffing during the pandemic.   [place] Controlled Substance Database was reviewed by me. ____________________________________________   FINAL CLINICAL IMPRESSION(S) / ED  DIAGNOSES   Final diagnoses:  Laceration of left hand without foreign body, initial encounter      NEW MEDICATIONS STARTED DURING THIS VISIT:  ED Discharge Orders    None       Note:  This document was prepared using Dragon voice recognition software and may include unintentional dictation errors.    Alfred Levins, [place], MD 01/30/20 [phone]

## 2020-01-30 NOTE — Discharge Instructions (Addendum)
Keep your hand dry and clean.  Keep the derma strips in place.  They will fall off after several days once the laceration is healing.  Watch for any signs of infection including redness of the skin, if the skin feels hot to the touch, fever, or pus.  If those develop return to the emergency room.
